# Patient Record
Sex: Male | Born: 1955 | Race: Black or African American | Hispanic: No | Marital: Married | Smoking: Current every day smoker
Health system: Southern US, Community
[De-identification: ages and names within clinical notes are randomized; demographics above are authoritative.]

## PROBLEM LIST (undated history)

## (undated) DIAGNOSIS — E785 Hyperlipidemia, unspecified: Secondary | ICD-10-CM

## (undated) DIAGNOSIS — E119 Type 2 diabetes mellitus without complications: Secondary | ICD-10-CM

## (undated) DIAGNOSIS — F17200 Nicotine dependence, unspecified, uncomplicated: Secondary | ICD-10-CM

## (undated) HISTORY — DX: Nicotine dependence, unspecified, uncomplicated: F17.200

## (undated) HISTORY — PX: LEG SURGERY: SHX1003

## (undated) HISTORY — DX: Hyperlipidemia, unspecified: E78.5

## (undated) HISTORY — DX: Type 2 diabetes mellitus without complications: E11.9

---

## 2009-02-05 ENCOUNTER — Ambulatory Visit: Payer: Self-pay | Admitting: Family Medicine

## 2009-02-05 IMAGING — CR DG LUMBAR SPINE 2-3V
1 series · 3 of 3 positions shown · non-contrast
Comparison: none

REASON FOR EXAM: [DATE]; [DATE]
COMMENTS:

[Series 2: view not recorded · 0.17mm/px · 3 of 3 slices shown]
[im 1/3]
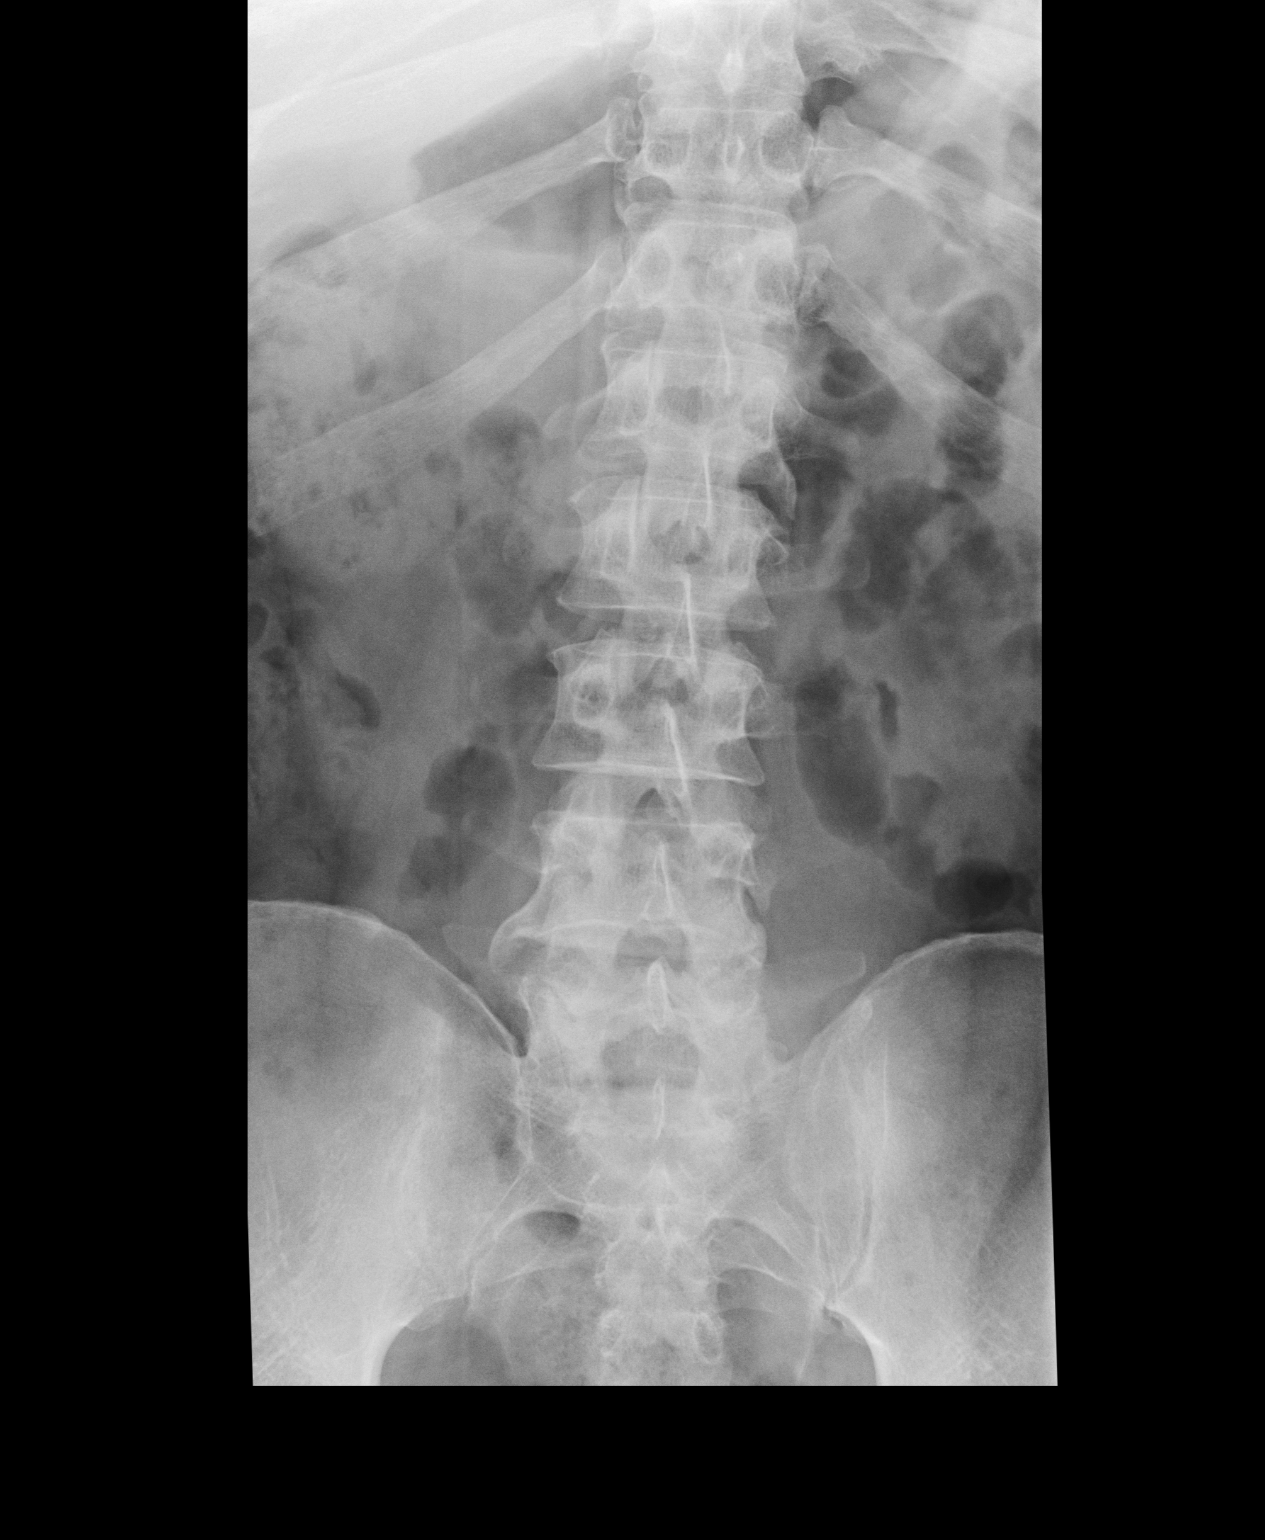
[im 2/3]
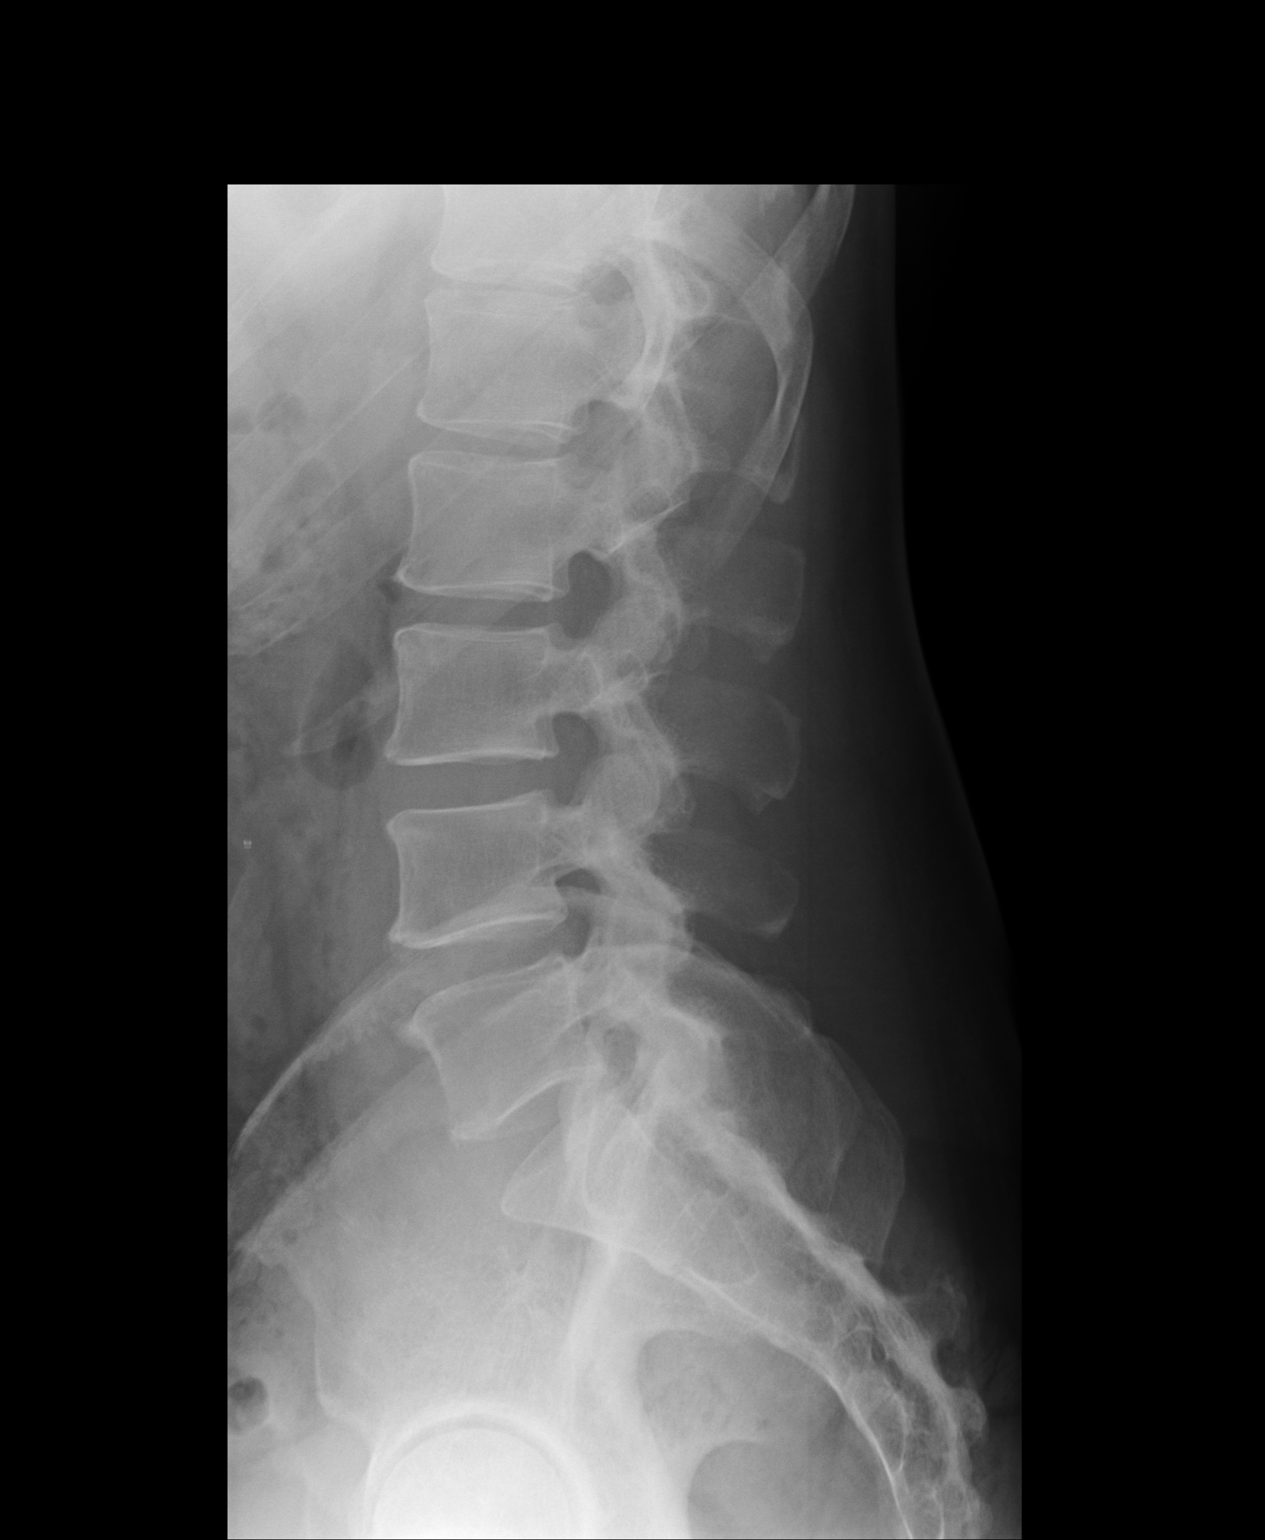
[im 3/3]
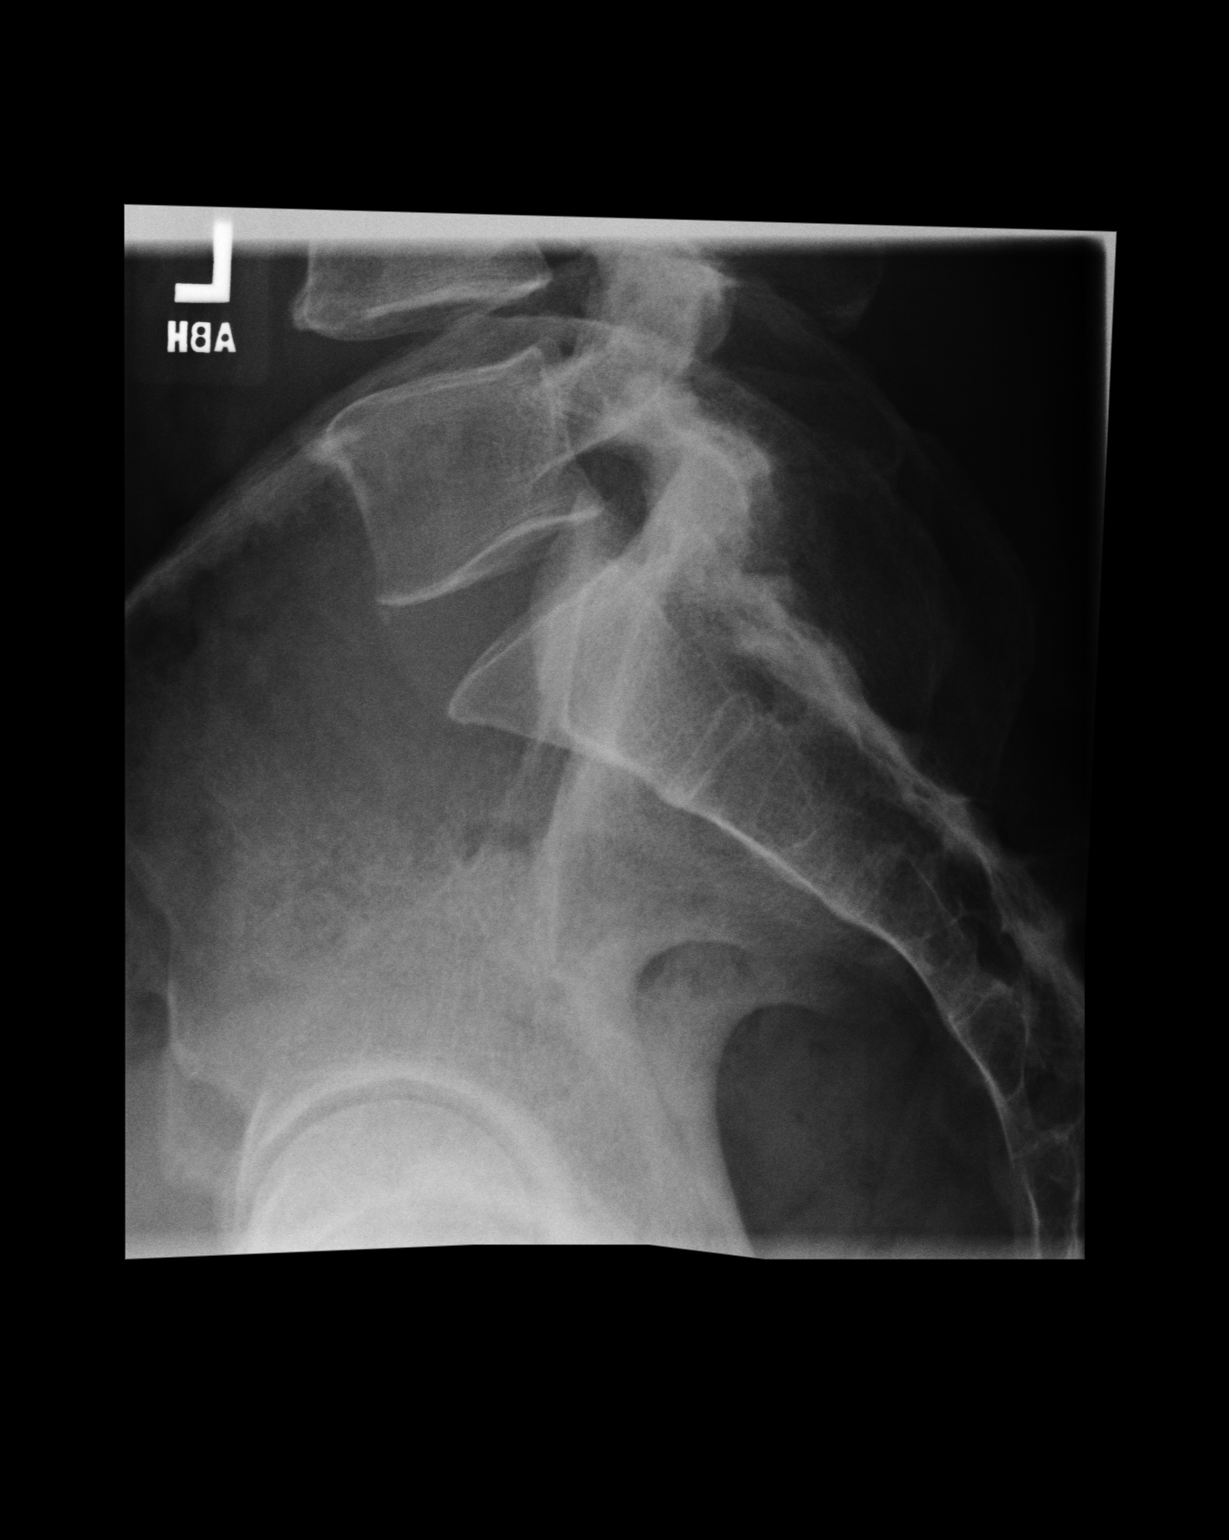

[3 of 3 positions shown; findings below may reference images not displayed]

PROCEDURE:     KDR - KDXR LUMBAR SPINE AP AND LATERAL  - [DATE]  [DATE]

RESULT:     The vertebral body heights and the intervertebral disc spaces
are well maintained. The vertebral body alignment is normal. The pedicles
are bilaterally intact. There is mild degenerative spurring anteriorly at
multiple levels of the lumbar spine.
IMPRESSION: 1. No acute bony abnormalities are seen.
2. Degenerative spurring is noted anteriorly at multiple levels.

## 2012-10-20 ENCOUNTER — Emergency Department: Payer: Self-pay | Admitting: Internal Medicine

## 2012-10-20 LAB — CBC
MCH: 29.8 pg (ref 26.0–34.0)
MCHC: 33.5 g/dL (ref 32.0–36.0)
MCV: 89 fL (ref 80–100)
Platelet: 185 10*3/uL (ref 150–440)
RBC: 4.59 10*6/uL (ref 4.40–5.90)
RDW: 13.4 % (ref 11.5–14.5)
WBC: 6.2 10*3/uL (ref 3.8–10.6)

## 2012-10-20 LAB — CK TOTAL AND CKMB (NOT AT ARMC)
CK, Total: 348 U/L — ABNORMAL HIGH (ref 35–232)
CK-MB: 2.1 ng/mL (ref 0.5–3.6)

## 2012-10-20 LAB — URINALYSIS, COMPLETE
Bacteria: NONE SEEN
Bilirubin,UR: NEGATIVE
Glucose,UR: NEGATIVE mg/dL (ref 0–75)
Ketone: NEGATIVE
Protein: 100
RBC,UR: 2 /HPF (ref 0–5)
Specific Gravity: 1.018 (ref 1.003–1.030)
Squamous Epithelial: 3
WBC UR: 3 /HPF (ref 0–5)

## 2012-10-20 LAB — TROPONIN I
Troponin-I: <0.02 ng/mL
Troponin-I: <0.02 ng/mL

## 2012-10-20 LAB — COMPREHENSIVE METABOLIC PANEL
Albumin: 3.6 g/dL (ref 3.4–5.0)
Alkaline Phosphatase: 73 U/L (ref 50–136)
BUN: 10 mg/dL (ref 7–18)
Bilirubin,Total: 0.2 mg/dL (ref 0.2–1.0)
Chloride: 106 mmol/L (ref 98–107)
Creatinine: 1.21 mg/dL (ref 0.60–1.30)
EGFR (African American): >60
EGFR (Non-African Amer.): >60
Glucose: 112 mg/dL — ABNORMAL HIGH (ref 65–99)
Osmolality: 275 (ref 275–301)
SGPT (ALT): 19 U/L (ref 12–78)
Sodium: 138 mmol/L (ref 136–145)

## 2012-10-20 IMAGING — CT CT HEAD WITHOUT CONTRAST
1 series · 16 of 30 positions shown, 20 images · non-contrast
Comparison: none

REASON FOR EXAM: ha
COMMENTS:

PROCEDURE:     CT  - CT HEAD WITHOUT CONTRAST  - [DATE] [DATE]
RESULT:     Comparison:  None
TECHNIQUE: Multiple axial images from the foramen magnum to the vertex were
obtained without IV contrast.

[Series 2: soft tissue · axial · 0.42mm/px · z∈[-155,-20]mm · 16 of 30 slices shown, 20 images]
[im 2/30  brain]
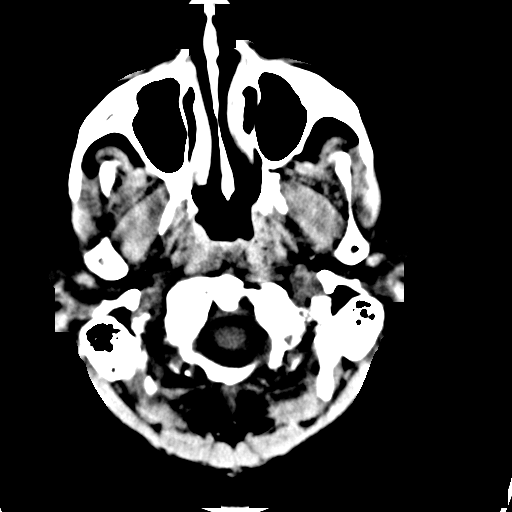
[im 2/30  bone]
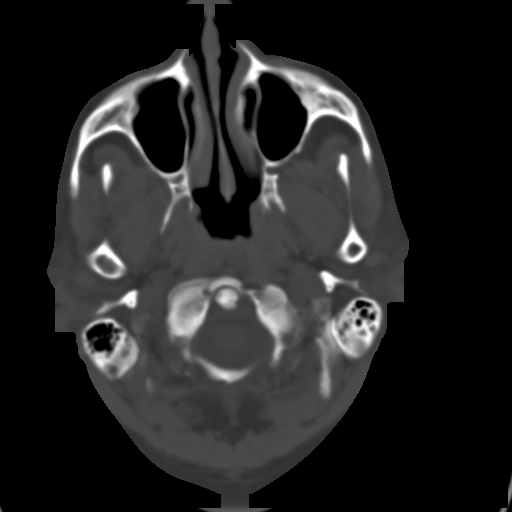
[im 4/30  brain]
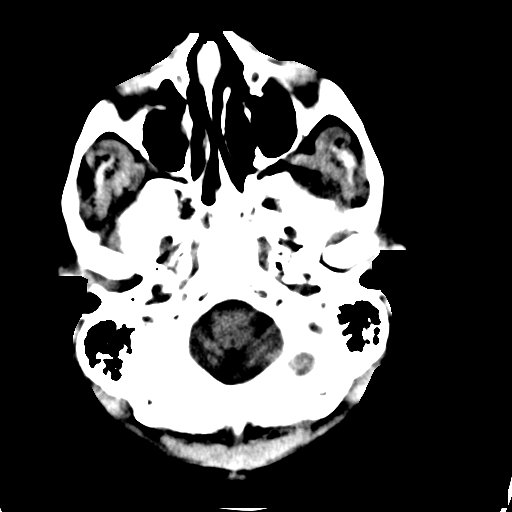
[im 6/30  brain]
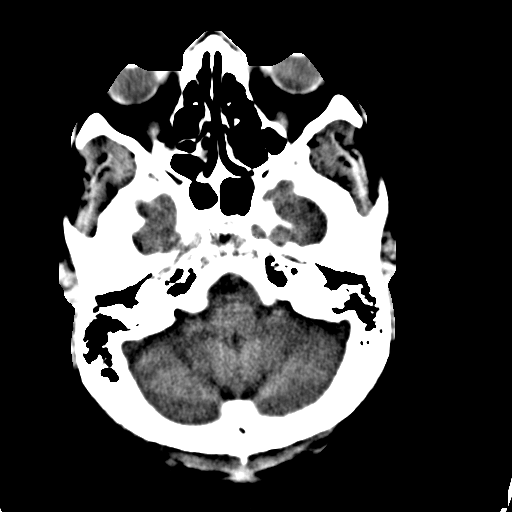
[im 8/30  brain]
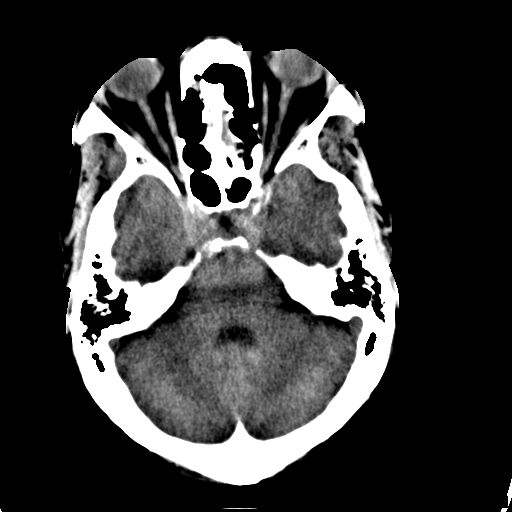
[im 9/30  brain]
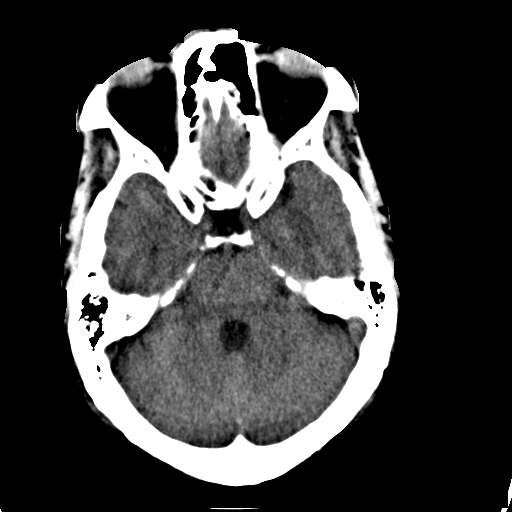
[im 9/30  bone]
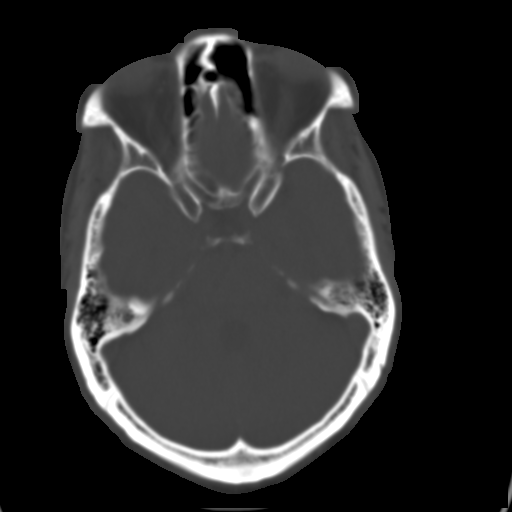
[im 11/30  brain]
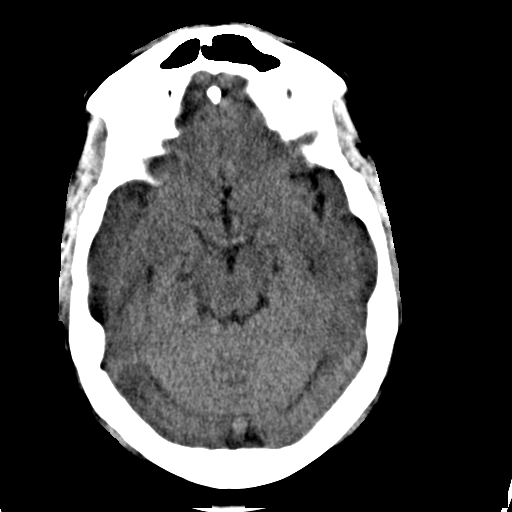
[im 13/30  brain]
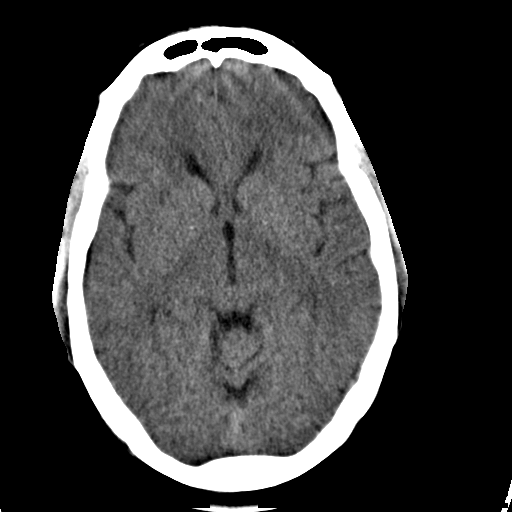
[im 15/30  brain]
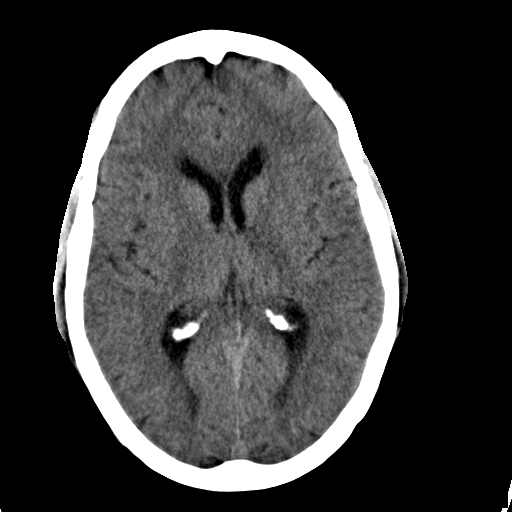
[im 16/30  brain]
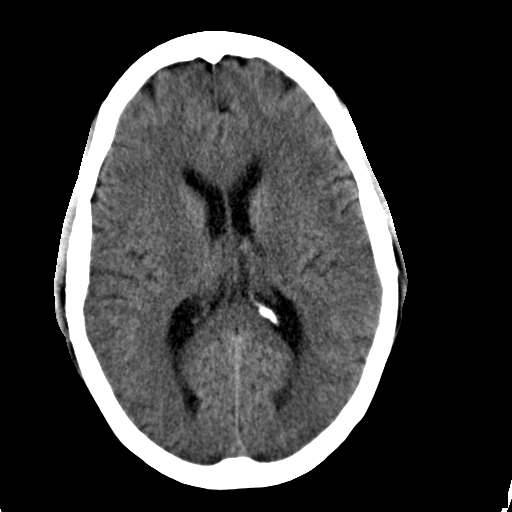
[im 16/30  bone]
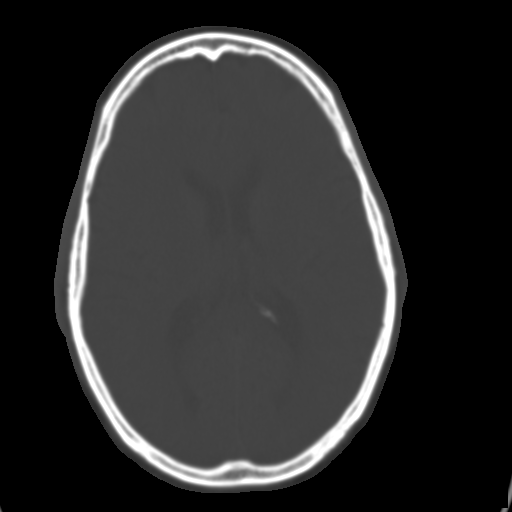
[im 18/30  brain]
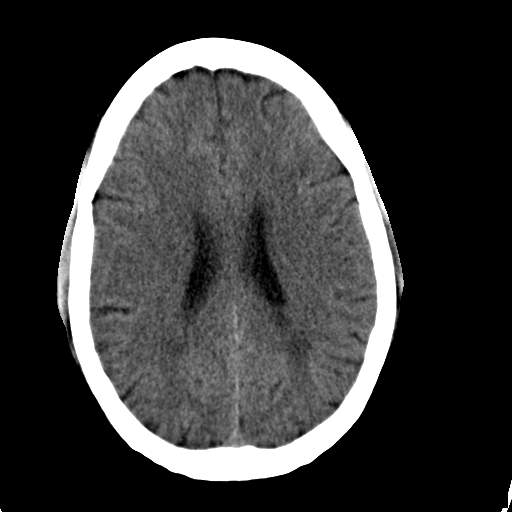
[im 20/30  brain]
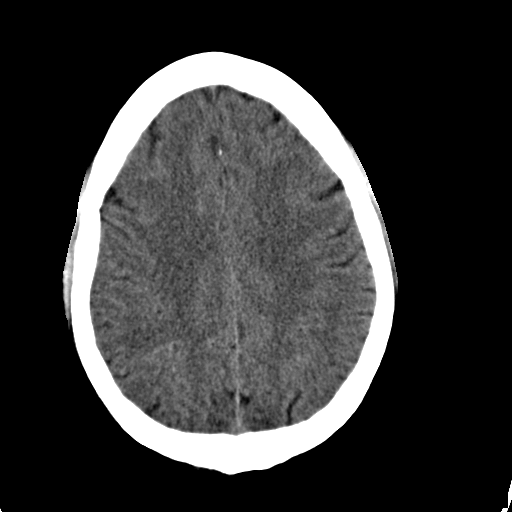
[im 22/30  brain]
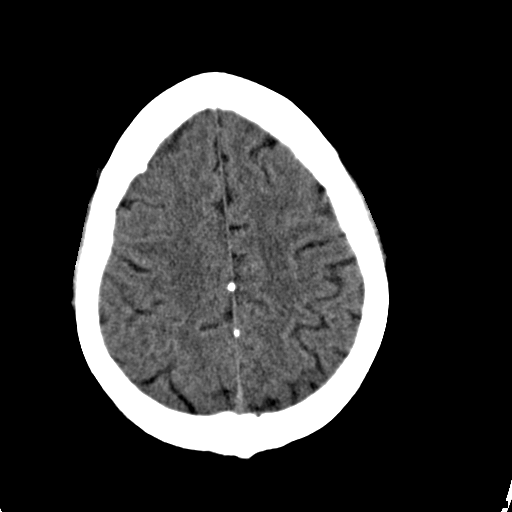
[im 23/30  brain]
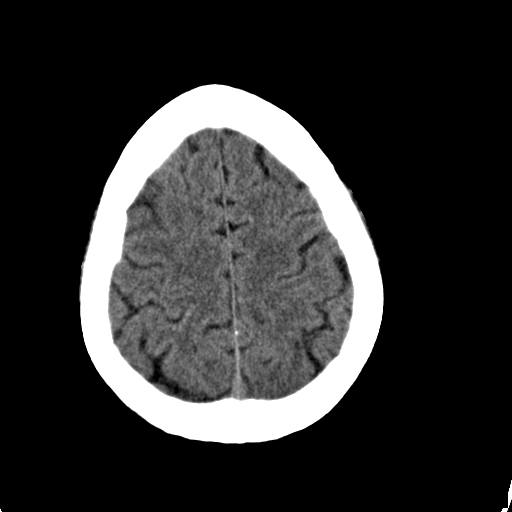
[im 23/30  bone]
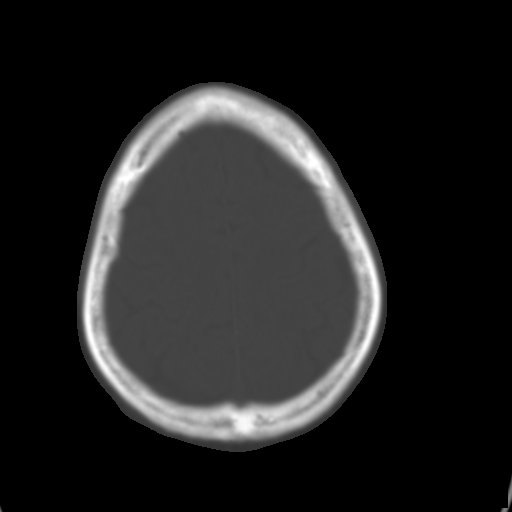
[im 25/30  brain]
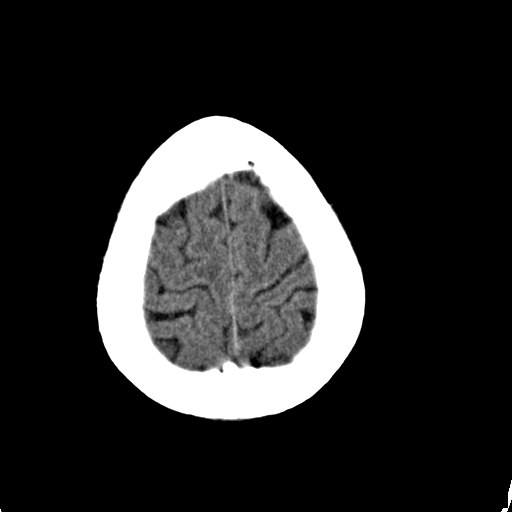
[im 27/30  brain]
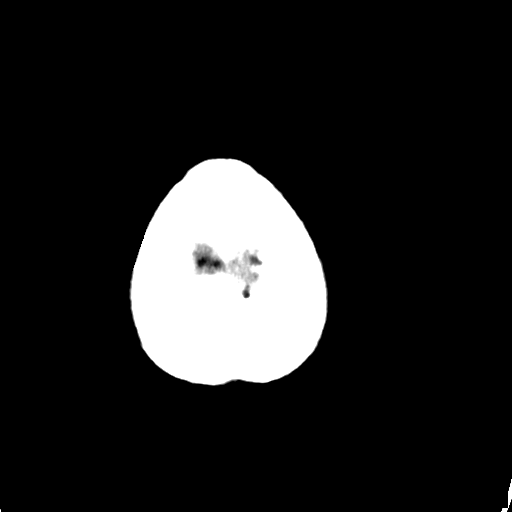
[im 29/30  brain]
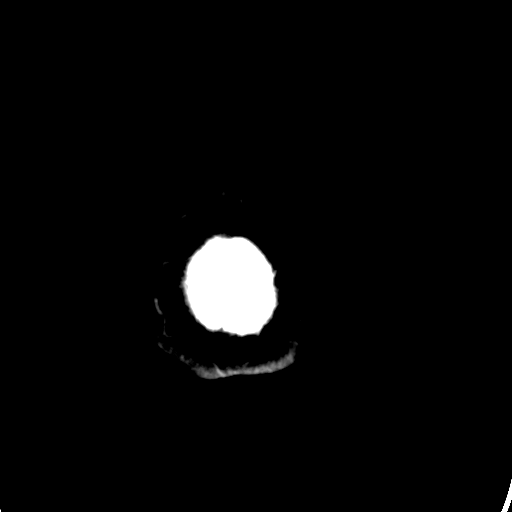

[16 of 30 positions shown; findings below may reference images not displayed]

FINDINGS: There is no evidence of mass effect, midline shift, or extra-axial fluid
collections.  There is no evidence of a space-occupying lesion or
intracranial hemorrhage. There is no evidence of a cortical-based area of
acute infarction.

The ventricles and sulci are appropriate for the patient's age. The basal
cisterns are patent.

Visualized portions of the orbits are unremarkable. The visualized portions
of the paranasal sinuses and mastoid air cells are unremarkable.

The osseous structures are unremarkable.
IMPRESSION: No acute intracranial process.

[REDACTED]

## 2016-02-27 ENCOUNTER — Other Ambulatory Visit: Payer: Self-pay | Admitting: Family Medicine

## 2016-04-24 ENCOUNTER — Other Ambulatory Visit: Payer: Self-pay | Admitting: Family Medicine

## 2016-04-24 DIAGNOSIS — E119 Type 2 diabetes mellitus without complications: Secondary | ICD-10-CM

## 2016-04-24 DIAGNOSIS — I10 Essential (primary) hypertension: Secondary | ICD-10-CM

## 2016-04-24 MED ORDER — HYDROCHLOROTHIAZIDE 12.5 MG PO CAPS: 12.5000 mg | ORAL_CAPSULE | Freq: Every day | ORAL | Status: DC

## 2016-04-24 MED ORDER — METFORMIN HCL 500 MG PO TABS: 500.0000 mg | ORAL_TABLET | Freq: Two times a day (BID) | ORAL | Status: DC

## 2016-04-24 MED ORDER — GLIPIZIDE 10 MG PO TABS: 10.0000 mg | ORAL_TABLET | Freq: Every day | ORAL | Status: DC

## 2016-04-25 ENCOUNTER — Encounter: Payer: Self-pay | Admitting: Family Medicine

## 2017-05-31 ENCOUNTER — Other Ambulatory Visit: Payer: Self-pay | Admitting: Family Medicine

## 2017-05-31 DIAGNOSIS — I10 Essential (primary) hypertension: Secondary | ICD-10-CM

## 2017-05-31 DIAGNOSIS — E119 Type 2 diabetes mellitus without complications: Secondary | ICD-10-CM
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
## 2019-07-18 ENCOUNTER — Other Ambulatory Visit: Payer: Self-pay

## 2019-07-18 ENCOUNTER — Encounter: Payer: Self-pay | Admitting: Internal Medicine

## 2019-07-18 ENCOUNTER — Ambulatory Visit (INDEPENDENT_AMBULATORY_CARE_PROVIDER_SITE_OTHER): Payer: Managed Care, Other (non HMO) | Admitting: Internal Medicine

## 2019-07-18 VITALS — BP 144/80 | HR 78 | Ht 71.0 in | Wt 188.0 lb

## 2019-07-18 DIAGNOSIS — R9431 Abnormal electrocardiogram [ECG] [EKG]: Secondary | ICD-10-CM

## 2019-07-18 DIAGNOSIS — I10 Essential (primary) hypertension: Secondary | ICD-10-CM | POA: Diagnosis not present

## 2019-07-18 DIAGNOSIS — N289 Disorder of kidney and ureter, unspecified: Secondary | ICD-10-CM | POA: Diagnosis not present

## 2019-07-18 DIAGNOSIS — R0609 Other forms of dyspnea: Secondary | ICD-10-CM

## 2019-07-18 NOTE — Patient Instructions (Addendum)
Medication Instructions:  Your physician recommends that you continue on your current medications as directed. Please refer to the Current Medication list given to you today.  If you need a refill on your cardiac medications before your next appointment, please call your pharmacy.   Lab work: - None ordered.  If you have labs (blood work) drawn today and your tests are completely normal, you will receive your results only by: Marland Kitchen MyChart Message (if you have MyChart) OR . A paper copy in the mail If you have any lab test that is abnormal or we need to change your treatment, we will call you to review the results.  Testing/Procedures: Your physician has requested that you have an echocardiogram. Echocardiography is a painless test that uses sound waves to create images of your heart. It provides your doctor with information about the size and shape of your heart and how well your heart's chambers and valves are working. This procedure takes approximately one hour. There are no restrictions for this procedure. You may get an IV, if needed, to receive an ultrasound enhancing agent through to better visualize your heart.    Follow-Up: At [facility], you and your health needs are our priority.  As part of our continuing mission to provide you with exceptional heart care, we have created designated Provider Care Teams.  These Care Teams include your primary Cardiologist (physician) and Advanced Practice Providers (APPs -  Physician Assistants and Nurse Practitioners) who all work together to provide you with the care you need, when you need it. You will need a follow up appointment in 1 months.  You may see DR Harrell Gave END or one of the following Advanced Practice Providers on your designated Care Team:   Murray Hodgkins, NP Christell Faith, PA-C . Marrianne Mood, PA-C    Echocardiogram An echocardiogram is a procedure that uses painless sound waves (ultrasound) to produce an image of the  heart. Images from an echocardiogram can provide important information about:  Signs of coronary artery disease (CAD).  Aneurysm detection. An aneurysm is a weak or damaged part of an artery wall that bulges out from the normal force of blood pumping through the body.  Heart size and shape. Changes in the size or shape of the heart can be associated with certain conditions, including heart failure, aneurysm, and CAD.  Heart muscle function.  Heart valve function.  Signs of a past heart attack.  Fluid buildup around the heart.  Thickening of the heart muscle.  A tumor or infectious growth around the heart valves. Tell a health care provider about:  Any allergies you have.  All medicines you are taking, including vitamins, herbs, eye drops, creams, and over-the-counter medicines.  Any blood disorders you have.  Any surgeries you have had.  Any medical conditions you have.  Whether you are pregnant or may be pregnant. What are the risks? Generally, this is a safe procedure. However, problems may occur, including:  Allergic reaction to dye (contrast) that may be used during the procedure. What happens before the procedure? No specific preparation is needed. You may eat and drink normally. What happens during the procedure?   An IV tube may be inserted into one of your veins.  You may receive contrast through this tube. A contrast is an injection that improves the quality of the pictures from your heart.  A gel will be applied to your chest.  A wand-like tool (transducer) will be moved over your chest. The gel will  help to transmit the sound waves from the transducer.  The sound waves will harmlessly bounce off of your heart to allow the heart images to be captured in real-time motion. The images will be recorded on a computer. The procedure may vary among health care providers and hospitals. What happens after the procedure?  You may return to your normal, everyday  life, including diet, activities, and medicines, unless your health care provider tells you not to do that. Summary  An echocardiogram is a procedure that uses painless sound waves (ultrasound) to produce an image of the heart.  Images from an echocardiogram can provide important information about the size and shape of your heart, heart muscle function, heart valve function, and fluid buildup around your heart.  You do not need to do anything to prepare before this procedure. You may eat and drink normally.  After the echocardiogram is completed, you may return to your normal, everyday life, unless your health care provider tells you not to do that. This information is not intended to replace advice given to you by your health care provider. Make sure you discuss any questions you have with your health care provider. Document Released: 10/17/2000 Document Revised: 02/10/2019 Document Reviewed: 11/22/2016 Elsevier Patient Education  2020 [employer].

## 2019-08-12 ENCOUNTER — Ambulatory Visit (INDEPENDENT_AMBULATORY_CARE_PROVIDER_SITE_OTHER): Payer: Managed Care, Other (non HMO)

## 2019-08-12 ENCOUNTER — Other Ambulatory Visit: Payer: Self-pay

## 2019-08-12 DIAGNOSIS — R0600 Dyspnea, unspecified: Secondary | ICD-10-CM | POA: Diagnosis not present

## 2019-08-12 DIAGNOSIS — R9431 Abnormal electrocardiogram [ECG] [EKG]: Secondary | ICD-10-CM

## 2019-08-12 DIAGNOSIS — R0609 Other forms of dyspnea: Secondary | ICD-10-CM

## 2019-08-15 ENCOUNTER — Telehealth: Payer: Self-pay | Admitting: *Deleted

## 2019-08-15 DIAGNOSIS — R079 Chest pain, unspecified: Secondary | ICD-10-CM

## 2019-08-15 DIAGNOSIS — R9431 Abnormal electrocardiogram [ECG] [EKG]: Secondary | ICD-10-CM

## 2019-08-15 NOTE — Telephone Encounter (Signed)
-----   Message from Nelva Bush, MD sent at 08/15/2019  7:30 AM EDT ----- Please let Mr. Grinder know that his heart is contracting normally without significant valvular abnormalities.  There is mild stiffening of the left ventricle, which may be related to his history of hypertension, diabetes, and age.  Given atypical chest pain and abnormal EKG discussed at our recent visit, I recommend that we obtain a pharmacologic myocardial perfusion stress test at Mr. Difabio' convenience.

## 2019-08-16 NOTE — Telephone Encounter (Signed)
Results called to pt. Pt verbalized understanding and is agreeable to [place]. He verbalized understanding of the following preprocedural instructions.    [place]  Your caregiver has ordered a Stress Test with nuclear imaging. The purpose of this test is to evaluate the blood supply to your heart muscle. This procedure is referred to as a "Non-Invasive Stress Test." This is because other than having an IV started in your vein, nothing is inserted or "invades" your body. Cardiac stress tests are done to find areas of poor blood flow to the heart by determining the extent of coronary artery disease (CAD). Some patients exercise on a treadmill, which naturally increases the blood flow to your heart, while others who are  unable to walk on a treadmill due to physical limitations have a pharmacologic/chemical stress agent called Lexiscan . This medicine will mimic walking on a treadmill by temporarily increasing your coronary blood flow.   Please note: these test may take anywhere between 2-4 hours to complete  PLEASE REPORT TO [facility] AT THE FIRST DESK WILL DIRECT YOU WHERE TO GO  Date of Procedure:____10/16/20__________  Arrival Time for Procedure:_____08:15 am___________  Instructions regarding medication:   _xx_ : Hold diabetes medication morning of procedure - Metformin/Glipizide  _xx_:  Hold other medications as follows:________HCTZ ____  PLEASE NOTIFY THE OFFICE AT LEAST 24 HOURS IN ADVANCE IF YOU ARE UNABLE TO [place].  [phone] AND  PLEASE NOTIFY NUCLEAR MEDICINE AT [facility] AT LEAST 24 HOURS IN ADVANCE IF YOU ARE UNABLE TO KEEP YOUR APPOINTMENT. [phone]  How to prepare for your Myoview test:  1. Do not eat or drink after midnight 2. No caffeine for 24 hours prior to test 3. No smoking 24 hours prior to test. 4. Your medication may be taken with water.  If your doctor stopped a medication because of this test, do not take  that medication. 5. Ladies, please do not wear dresses.  Skirts or pants are appropriate. Please wear a short sleeve shirt. 6. No perfume, cologne or lotion. 7. Wear comfortable walking shoes. No heels!

## 2019-08-17 ENCOUNTER — Ambulatory Visit: Payer: Medicare HMO | Admitting: Internal Medicine

## 2019-08-19 ENCOUNTER — Encounter
Admission: RE | Admit: 2019-08-19 | Discharge: 2019-08-19 | Disposition: A | Discharge status: Home / Self Care | Payer: Managed Care, Other (non HMO) | Source: Ambulatory Visit | Attending: Internal Medicine | Admitting: Internal Medicine

## 2019-08-19 ENCOUNTER — Other Ambulatory Visit: Payer: Self-pay

## 2019-08-19 DIAGNOSIS — R079 Chest pain, unspecified: Secondary | ICD-10-CM | POA: Diagnosis present

## 2019-08-19 DIAGNOSIS — R9431 Abnormal electrocardiogram [ECG] [EKG]: Secondary | ICD-10-CM | POA: Insufficient documentation

## 2019-08-19 LAB — NM MYOCAR MULTI W/SPECT W/WALL MOTION / EF
Estimated workload: 1 METS
Exercise duration (min): 0 min
Exercise duration (sec): 0 s
LV dias vol: 70 mL (ref 62–150)
LV sys vol: 28 mL
MPHR: 155 {beats}/min
Peak HR: 88 {beats}/min
Percent HR: 56 %
Rest HR: 53 {beats}/min
SDS: 0
SRS: 3
SSS: 0
TID: 0.93

## 2019-08-19 MED ORDER — REGADENOSON 0.4 MG/5ML IV SOLN
0.4000 mg | Freq: Once | INTRAVENOUS | Status: AC
  Administered 2019-08-19: 0.4 mg via INTRAVENOUS
  Filled 2019-08-19: qty 5

## 2019-08-19 MED ORDER — TECHNETIUM TC 99M TETROFOSMIN IV KIT
30.0000 | PACK | Freq: Once | INTRAVENOUS | Status: AC | PRN
  Administered 2019-08-19: 30.98 via INTRAVENOUS

## 2019-08-19 MED ORDER — TECHNETIUM TC 99M TETROFOSMIN IV KIT
10.0730 | PACK | Freq: Once | INTRAVENOUS | Status: AC | PRN
  Administered 2019-08-19: 10.073 via INTRAVENOUS

## 2019-08-22 NOTE — Progress Notes (Signed)
Follow-up Outpatient Visit Date: 08/24/2019  Primary Care Provider: Marguerita Merles, [place] [address]  Chief Complaint: Follow-up shortness of breath and abnormal EKG.  HPI:  Stephen Alvarado is a 63 y.o. year-old male with history of hypertension, hyperlipidemia, and type 2 diabetes mellitus, who presents for follow-up of hypertension and abnormal EKG.  I met Stephen Alvarado last month, at which time he endorsed dyspnea with vigorous exertion that he attributed to his long history of tobacco use.  We agreed to perform a echocardiogram followed by pharmacologic myocardial perfusion stress test.  These studies were notable for LVEF 60-65% with grade 1 diastolic dysfunction and no significant ischemia.  Today, Stephen Alvarado reports feeling well other than intermittent right hip pain.  He was told in the past that he may need a hip replacement, though he would like to delay this as long as possible.  He denies chest pain, shortness of breath, and edema.  He does not check his BP at home.  --------------------------------------------------------------------------------------------------  Cardiovascular History & Procedures: Cardiovascular Problems:  Abnormal EKG  Risk Factors:  Hypertension, hyperlipidemia, diabetes mellitus, tobacco use, male gender, and age > 34  Cath/PCI:  None  CV Surgery:  None  EP Procedures and Devices:  None  Non-Invasive Evaluation(s):  Pharmacologic MPI (08/19/2019): Low risk study without ischemia or scar.  Mild aortic atherosclerosis noted.  TTE (08/12/2019): Normal LV size and wall thickness.  LVEF 60-65% with grade 1 diastolic dysfunction.  Normal RV size and function.  No significant valvular abnormality.  Recent CV Pertinent Labs: Lab Results  Component Value Date   K 4.1 10/20/2012   BUN 10 10/20/2012   CREATININE 1.21 10/20/2012    Past medical and surgical history were reviewed and updated in EPIC.  No outpatient  medications have been marked as taking for the 08/24/19 encounter (Office Visit) with Stephen Alvarado, Stephen Gave, MD.    Allergies: Penicillins  Social History   Tobacco Use  . Smoking status: Current Every Day Smoker    Packs/day: 0.66    Years: 20.00    Pack years: 13.20    Types: Cigarettes  . Smokeless tobacco: Never Used  Substance Use Topics  . Alcohol use: Not Currently  . Drug use: Never    Family History  Problem Relation Age of Onset  . Hypertension Mother   . Heart disease Father   . Hypertension Father   . Kidney failure Sister   . Diabetes Sister   . Kidney failure Brother   . Cirrhosis Sister   . Cancer Brother   . Cancer Brother     Review of Systems: A 12-system review of systems was performed and was negative except as noted in the HPI.  --------------------------------------------------------------------------------------------------  Physical Exam: BP 128/78 (BP Location: Left Arm, Patient Position: Sitting, Cuff Size: Normal)   Pulse 75   Ht _0  (1.803 m)   Wt 190 lb 8 oz (86.4 kg)   SpO2 98%   BMI 26.57 kg/m   General:  NAD HEENT: No conjunctival pallor or scleral icterus. Facemask in place. Neck: Supple without lymphadenopathy, thyromegaly, JVD, or HJR. Lungs: Normal work of breathing. Clear to auscultation bilaterally without wheezes or crackles. Heart: Regular rate and rhythm without murmurs, rubs, or gallops. Non-displaced PMI. Abd: Bowel sounds present. Soft, NT/ND without hepatosplenomegaly Ext: No lower extremity edema. Skin: Warm and dry without rash.  Lab Results  Component Value Date   WBC 6.2 10/20/2012   HGB 13.7 10/20/2012  HCT 41.0 10/20/2012   MCV 89 10/20/2012   PLT 185 10/20/2012    Lab Results  Component Value Date   NA 138 10/20/2012   K 4.1 10/20/2012   CL 106 10/20/2012   CO2 27 10/20/2012   BUN 10 10/20/2012   CREATININE 1.21 10/20/2012   GLUCOSE 112 (H) 10/20/2012   ALT 19 10/20/2012    No results found  for: CHOL, HDL, LDLCALC, LDLDIRECT, TRIG, CHOLHDL  --------------------------------------------------------------------------------------------------  ASSESSMENT AND PLAN: Dyspnea on exertion and abnormal EKG: Stephen Alvarado denies significant shortness of breath at today's visit.  Recent echocardiogram and myocardial perfusion stress test were reassuring without significant abnormalities.  I do not recommend any further testing at this time.  Primary prevention of CAD is recommended.  Hypertension: Blood pressure controlled today.  Continue current medications.  Aortic atherosclerosis: Incidentally noted on recent stress test.  I recommend continuation of aspirin and statin therapy.  Smoking cessation recommended.  Follow-up: Return to clinic as needed.  Nelva Bush, MD 08/24/2019 2:25 PM

## 2019-08-24 ENCOUNTER — Other Ambulatory Visit: Payer: Self-pay

## 2019-08-24 ENCOUNTER — Ambulatory Visit (INDEPENDENT_AMBULATORY_CARE_PROVIDER_SITE_OTHER): Payer: Managed Care, Other (non HMO) | Admitting: Internal Medicine

## 2019-08-24 ENCOUNTER — Encounter: Payer: Self-pay | Admitting: Internal Medicine

## 2019-08-24 VITALS — BP 128/78 | HR 75 | Ht 71.0 in | Wt 190.5 lb

## 2019-08-24 DIAGNOSIS — I10 Essential (primary) hypertension: Secondary | ICD-10-CM | POA: Diagnosis not present

## 2019-08-24 DIAGNOSIS — R9431 Abnormal electrocardiogram [ECG] [EKG]: Secondary | ICD-10-CM

## 2019-08-24 DIAGNOSIS — R0609 Other forms of dyspnea: Secondary | ICD-10-CM

## 2019-08-24 DIAGNOSIS — I700 Atherosclerosis of aorta: Secondary | ICD-10-CM

## 2019-08-24 DIAGNOSIS — R0600 Dyspnea, unspecified: Secondary | ICD-10-CM

## 2019-08-24 NOTE — Patient Instructions (Signed)
Medication Instructions:  Your physician recommends that you continue on your current medications as directed. Please refer to the Current Medication list given to you today.  *If you need a refill on your cardiac medications before your next appointment, please call your pharmacy*  Lab Work: none If you have labs (blood work) drawn today and your tests are completely normal, you will receive your results only by: Marland Kitchen MyChart Message (if you have MyChart) OR . A paper copy in the mail If you have any lab test that is abnormal or we need to change your treatment, we will call you to review the results.  Testing/Procedures: none  Follow-Up: At [facility], you and your health needs are our priority.  As part of our continuing mission to provide you with exceptional heart care, we have created designated Provider Care Teams.  These Care Teams include your primary Cardiologist (physician) and Advanced Practice Providers (APPs -  Physician Assistants and Nurse Practitioners) who all work together to provide you with the care you need, when you need it.  Your next appointment:   as needed.  The format for your next appointment:   n/a  Provider:    You may see DR Harrell Gave END or one of the following Advanced Practice Providers on your designated Care Team:    Murray Hodgkins, NP  Christell Faith, PA-C  Marrianne Mood, PA-C

## 2019-08-25 ENCOUNTER — Encounter: Payer: Self-pay | Admitting: Internal Medicine

## 2019-08-25 DIAGNOSIS — R9431 Abnormal electrocardiogram [ECG] [EKG]: Secondary | ICD-10-CM | POA: Insufficient documentation

## 2019-08-25 DIAGNOSIS — I10 Essential (primary) hypertension: Secondary | ICD-10-CM | POA: Insufficient documentation

## 2019-08-25 DIAGNOSIS — I700 Atherosclerosis of aorta: Secondary | ICD-10-CM | POA: Insufficient documentation

## 2020-08-14 ENCOUNTER — Other Ambulatory Visit: Payer: Self-pay | Admitting: Nephrology

## 2020-08-14 DIAGNOSIS — R809 Proteinuria, unspecified: Secondary | ICD-10-CM

## 2020-08-14 DIAGNOSIS — N1832 Chronic kidney disease, stage 3b: Secondary | ICD-10-CM

## 2020-08-31 ENCOUNTER — Encounter: Payer: Self-pay | Admitting: Oncology

## 2020-08-31 NOTE — Progress Notes (Signed)
patient prescreened for New patient vist. Will establish care for [facility].

## 2020-09-03 ENCOUNTER — Other Ambulatory Visit: Payer: Self-pay

## 2020-09-03 ENCOUNTER — Encounter (INDEPENDENT_AMBULATORY_CARE_PROVIDER_SITE_OTHER): Payer: Self-pay

## 2020-09-03 ENCOUNTER — Inpatient Hospital Stay: Payer: Medicare HMO | Attending: Oncology | Admitting: Oncology

## 2020-09-03 ENCOUNTER — Inpatient Hospital Stay: Payer: Medicare HMO

## 2020-09-03 VITALS — BP 170/89 | HR 76 | Temp 97.8°F | Resp 18 | Wt 199.5 lb

## 2020-09-03 DIAGNOSIS — Z79899 Other long term (current) drug therapy: Secondary | ICD-10-CM | POA: Insufficient documentation

## 2020-09-03 DIAGNOSIS — E785 Hyperlipidemia, unspecified: Secondary | ICD-10-CM | POA: Insufficient documentation

## 2020-09-03 DIAGNOSIS — D472 Monoclonal gammopathy: Secondary | ICD-10-CM | POA: Diagnosis not present

## 2020-09-03 DIAGNOSIS — R778 Other specified abnormalities of plasma proteins: Secondary | ICD-10-CM | POA: Diagnosis not present

## 2020-09-03 DIAGNOSIS — E119 Type 2 diabetes mellitus without complications: Secondary | ICD-10-CM | POA: Diagnosis not present

## 2020-09-03 DIAGNOSIS — Z809 Family history of malignant neoplasm, unspecified: Secondary | ICD-10-CM | POA: Insufficient documentation

## 2020-09-03 DIAGNOSIS — D631 Anemia in chronic kidney disease: Secondary | ICD-10-CM | POA: Diagnosis not present

## 2020-09-03 DIAGNOSIS — N289 Disorder of kidney and ureter, unspecified: Secondary | ICD-10-CM | POA: Insufficient documentation

## 2020-09-03 DIAGNOSIS — Z833 Family history of diabetes mellitus: Secondary | ICD-10-CM | POA: Diagnosis not present

## 2020-09-03 DIAGNOSIS — N1832 Chronic kidney disease, stage 3b: Secondary | ICD-10-CM

## 2020-09-03 DIAGNOSIS — Z8249 Family history of ischemic heart disease and other diseases of the circulatory system: Secondary | ICD-10-CM | POA: Diagnosis not present

## 2020-09-03 DIAGNOSIS — Z7984 Long term (current) use of oral hypoglycemic drugs: Secondary | ICD-10-CM | POA: Diagnosis not present

## 2020-09-03 DIAGNOSIS — Z8379 Family history of other diseases of the digestive system: Secondary | ICD-10-CM | POA: Insufficient documentation

## 2020-09-03 DIAGNOSIS — Z7982 Long term (current) use of aspirin: Secondary | ICD-10-CM | POA: Insufficient documentation

## 2020-09-03 DIAGNOSIS — C8590 Non-Hodgkin lymphoma, unspecified, unspecified site: Secondary | ICD-10-CM | POA: Diagnosis present

## 2020-09-03 DIAGNOSIS — F17210 Nicotine dependence, cigarettes, uncomplicated: Secondary | ICD-10-CM | POA: Diagnosis not present

## 2020-09-03 LAB — COMPREHENSIVE METABOLIC PANEL
ALT: 20 U/L (ref 0–44)
AST: 20 U/L (ref 15–41)
Albumin: 3.7 g/dL (ref 3.5–5.0)
Alkaline Phosphatase: 53 U/L (ref 38–126)
Anion gap: 8 (ref 5–15)
BUN: 23 mg/dL (ref 8–23)
CO2: 27 mmol/L (ref 22–32)
Calcium: 9.5 mg/dL (ref 8.9–10.3)
Chloride: 102 mmol/L (ref 98–111)
Creatinine, Ser: 1.86 mg/dL — ABNORMAL HIGH (ref 0.61–1.24)
GFR, Estimated: 39 mL/min — ABNORMAL LOW (ref >60)
Glucose, Bld: 190 mg/dL — ABNORMAL HIGH (ref 70–99)
Potassium: 4.3 mmol/L (ref 3.5–5.1)
Sodium: 137 mmol/L (ref 135–145)
Total Bilirubin: 0.6 mg/dL (ref 0.3–1.2)
Total Protein: 7.7 g/dL (ref 6.5–8.1)

## 2020-09-03 LAB — CBC WITH DIFFERENTIAL/PLATELET
Abs Immature Granulocytes: 0.03 10*3/uL (ref 0.00–0.07)
Basophils Absolute: 0 10*3/uL (ref 0.0–0.1)
Basophils Relative: 1 %
Eosinophils Absolute: 0.5 10*3/uL (ref 0.0–0.5)
Eosinophils Relative: 8 %
HCT: 37.1 % — ABNORMAL LOW (ref 39.0–52.0)
Hemoglobin: 12.5 g/dL — ABNORMAL LOW (ref 13.0–17.0)
Immature Granulocytes: 1 %
Lymphocytes Relative: 27 %
Lymphs Abs: 1.6 10*3/uL (ref 0.7–4.0)
MCH: 29.8 pg (ref 26.0–34.0)
MCHC: 33.7 g/dL (ref 30.0–36.0)
MCV: 88.5 fL (ref 80.0–100.0)
Monocytes Absolute: 0.4 10*3/uL (ref 0.1–1.0)
Monocytes Relative: 6 %
Neutro Abs: 3.5 10*3/uL (ref 1.7–7.7)
Neutrophils Relative %: 57 %
Platelets: 192 10*3/uL (ref 150–400)
RBC: 4.19 MIL/uL — ABNORMAL LOW (ref 4.22–5.81)
RDW: 13.1 % (ref 11.5–15.5)
WBC: 6 10*3/uL (ref 4.0–10.5)
nRBC: 0 % (ref 0.0–0.2)
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
## 2020-09-04 LAB — KAPPA/LAMBDA LIGHT CHAINS
Kappa free light chain: 387.5 mg/L — ABNORMAL HIGH (ref 3.3–19.4)
Kappa, lambda light chain ratio: 20.95 — ABNORMAL HIGH (ref 0.26–1.65)
Lambda free light chains: 18.5 mg/L (ref 5.7–26.3)

## 2020-09-04 LAB — MULTIPLE MYELOMA PANEL, SERUM
Albumin SerPl Elph-Mcnc: 3.5 g/dL (ref 2.9–4.4)
Albumin/Glob SerPl: 0.9 (ref 0.7–1.7)
Alpha 1: 0.2 g/dL (ref 0.0–0.4)
Alpha2 Glob SerPl Elph-Mcnc: 0.6 g/dL (ref 0.4–1.0)
B-Globulin SerPl Elph-Mcnc: 0.8 g/dL (ref 0.7–1.3)
Gamma Glob SerPl Elph-Mcnc: 2.3 g/dL — ABNORMAL HIGH (ref 0.4–1.8)
Globulin, Total: 3.9 g/dL (ref 2.2–3.9)
IgA: 82 mg/dL (ref 61–437)
IgG (Immunoglobin G), Serum: 913 mg/dL (ref 603–1613)
IgM (Immunoglobulin M), Srm: 1954 mg/dL — ABNORMAL HIGH (ref 20–172)
M Protein SerPl Elph-Mcnc: 1.7 g/dL — ABNORMAL HIGH
Total Protein ELP: 7.4 g/dL (ref 6.0–8.5)

## 2020-09-05 ENCOUNTER — Telehealth: Payer: Self-pay

## 2020-09-05 DIAGNOSIS — R778 Other specified abnormalities of plasma proteins: Secondary | ICD-10-CM

## 2020-09-05 NOTE — Telephone Encounter (Signed)
-----  Message from Earlie Server, MD sent at 09/05/2020 12:07 AM EDT ----- Please let him know that his blood work came back concerning for plasma cell disorders.  I recommend him to proceed with bone marrow biopsy, as well as skeletal survey. Please order and arrange. Please change his appt to be 1 weeks after bm biopsy.

## 2020-09-05 NOTE — Telephone Encounter (Signed)
Done..  Pt appt has been sched for 09/20/20 @ 9:00

## 2020-09-05 NOTE — Telephone Encounter (Signed)
Spoke to patient and he is ok with proceeding with BM bx. Notified him that [facility] Also notified him that his scheduled follow up appt may change depending on when BM bx is scheduled.   Order for procedures entered. Will send request for bone marrow bx to specialized scheduling.

## 2020-09-05 NOTE — Telephone Encounter (Signed)
Patient scheduled for bone marrow bx on wed 09/12/20 @ 8:30a, pt to arrive 7:30a. Please reschedule MD appt to be 1 week after biopsy. I will contact pt with appts.

## 2020-09-06 NOTE — Telephone Encounter (Signed)
Pt notified of all appts and voiced understanding.

## 2020-09-07 NOTE — Progress Notes (Signed)
Patient on schedule for BMB 09/12/2020,called and spoke with wife on phone. Made aware to be here @ 0730, NPO after MN, and driver for home after discharge. Will hold glypizide am of procedure. Stated understanding.

## 2020-09-10 ENCOUNTER — Other Ambulatory Visit: Payer: Self-pay | Admitting: Radiology

## 2020-09-12 ENCOUNTER — Other Ambulatory Visit: Payer: Self-pay

## 2020-09-12 ENCOUNTER — Ambulatory Visit
Admission: RE | Admit: 2020-09-12 | Discharge: 2020-09-12 | Disposition: A | Discharge status: Home / Self Care | Payer: Medicare HMO | Source: Ambulatory Visit | Attending: Oncology | Admitting: Oncology

## 2020-09-12 DIAGNOSIS — I129 Hypertensive chronic kidney disease with stage 1 through stage 4 chronic kidney disease, or unspecified chronic kidney disease: Secondary | ICD-10-CM | POA: Diagnosis not present

## 2020-09-12 DIAGNOSIS — C9000 Multiple myeloma not having achieved remission: Secondary | ICD-10-CM | POA: Insufficient documentation

## 2020-09-12 DIAGNOSIS — Z7984 Long term (current) use of oral hypoglycemic drugs: Secondary | ICD-10-CM | POA: Insufficient documentation

## 2020-09-12 DIAGNOSIS — N1832 Chronic kidney disease, stage 3b: Secondary | ICD-10-CM | POA: Diagnosis not present

## 2020-09-12 DIAGNOSIS — Z79899 Other long term (current) drug therapy: Secondary | ICD-10-CM | POA: Diagnosis not present

## 2020-09-12 DIAGNOSIS — F17210 Nicotine dependence, cigarettes, uncomplicated: Secondary | ICD-10-CM | POA: Diagnosis not present

## 2020-09-12 DIAGNOSIS — E1122 Type 2 diabetes mellitus with diabetic chronic kidney disease: Secondary | ICD-10-CM | POA: Diagnosis not present

## 2020-09-12 DIAGNOSIS — R778 Other specified abnormalities of plasma proteins: Secondary | ICD-10-CM

## 2020-09-12 DIAGNOSIS — D649 Anemia, unspecified: Secondary | ICD-10-CM | POA: Insufficient documentation

## 2020-09-12 DIAGNOSIS — C8516 Unspecified B-cell lymphoma, intrapelvic lymph nodes: Secondary | ICD-10-CM | POA: Insufficient documentation

## 2020-09-12 LAB — CBC WITH DIFFERENTIAL/PLATELET
Abs Immature Granulocytes: 0.03 10*3/uL (ref 0.00–0.07)
Basophils Absolute: 0 10*3/uL (ref 0.0–0.1)
Basophils Relative: 1 %
Eosinophils Absolute: 0.5 10*3/uL (ref 0.0–0.5)
Eosinophils Relative: 7 %
HCT: 34.1 % — ABNORMAL LOW (ref 39.0–52.0)
Hemoglobin: 11.4 g/dL — ABNORMAL LOW (ref 13.0–17.0)
Immature Granulocytes: 0 %
Lymphocytes Relative: 25 %
Lymphs Abs: 1.7 10*3/uL (ref 0.7–4.0)
MCH: 29.7 pg (ref 26.0–34.0)
MCHC: 33.4 g/dL (ref 30.0–36.0)
MCV: 88.8 fL (ref 80.0–100.0)
Monocytes Absolute: 0.5 10*3/uL (ref 0.1–1.0)
Monocytes Relative: 7 %
Neutro Abs: 4 10*3/uL (ref 1.7–7.7)
Neutrophils Relative %: 60 %
Platelets: 170 10*3/uL (ref 150–400)
RBC: 3.84 MIL/uL — ABNORMAL LOW (ref 4.22–5.81)
RDW: 13 % (ref 11.5–15.5)
WBC: 6.7 10*3/uL (ref 4.0–10.5)
nRBC: 0 % (ref 0.0–0.2)

## 2020-09-12 LAB — GLUCOSE, CAPILLARY: Glucose-Capillary: 209 mg/dL — ABNORMAL HIGH (ref 70–99)

## 2020-09-12 IMAGING — CT CT BIOPSY AND ASPIRATION BONE MARROW
1 of 2 series · 9 of 14 positions shown, 12 images · non-contrast
Comparison: none

INDICATION: 64-year-old male with abnormal SPEP.

[Series 2: i-spiral 5.0 b30f · axial · 0.67mm/px · z∈[-95,-36]mm · 9 of 23 slices shown, 12 images]
[im 3/23  soft-tissue]
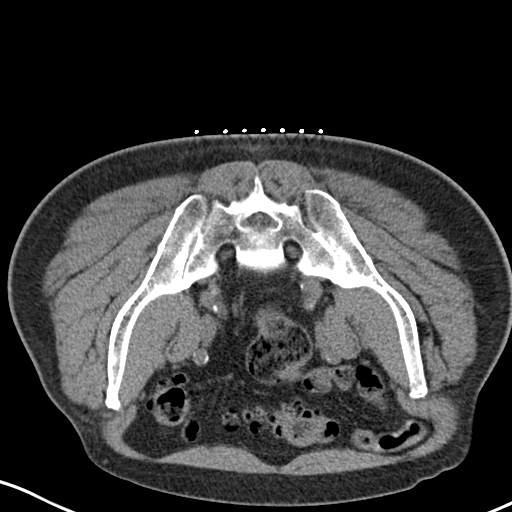
[im 3/23  bone]
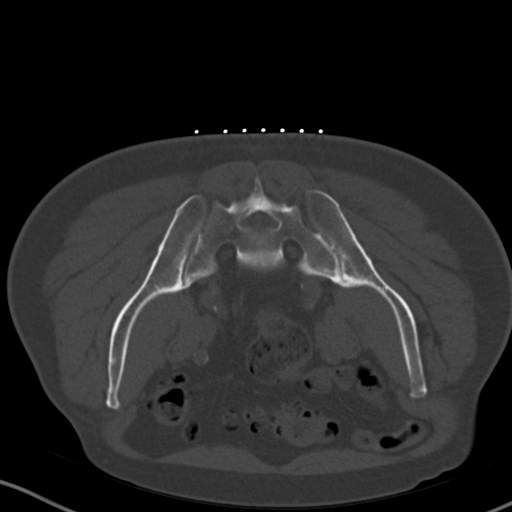
[im 5/23  bone]
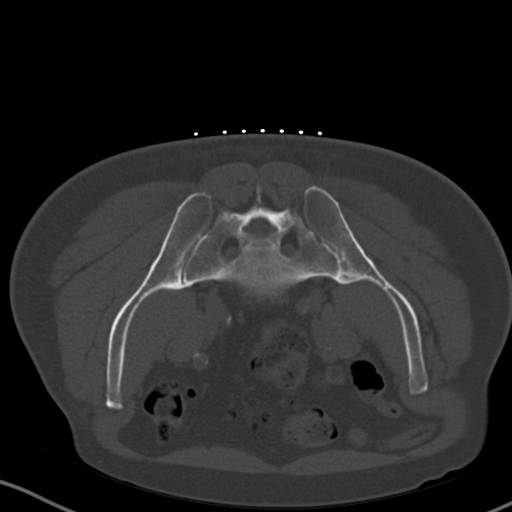
[im 7/23  bone]
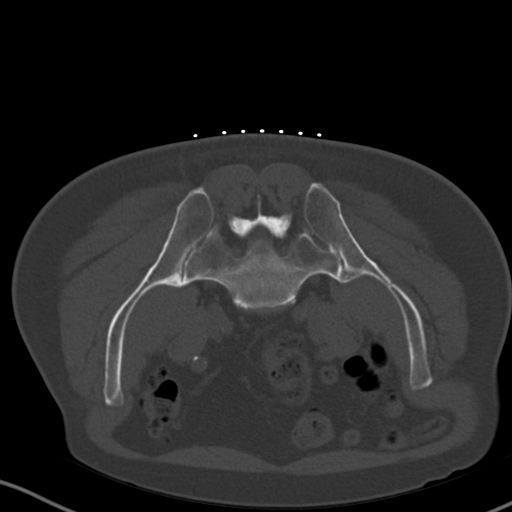
[im 9/23  bone]
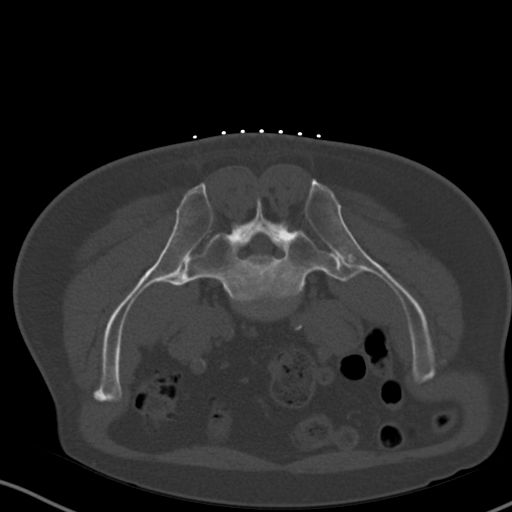
[im 12/23  soft-tissue]
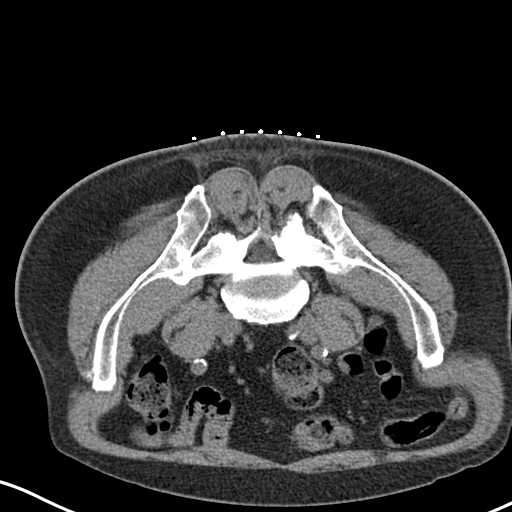
[im 12/23  bone]
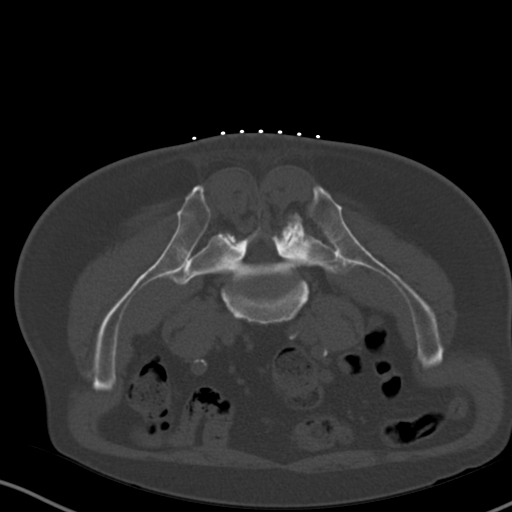
[im 14/23  bone]
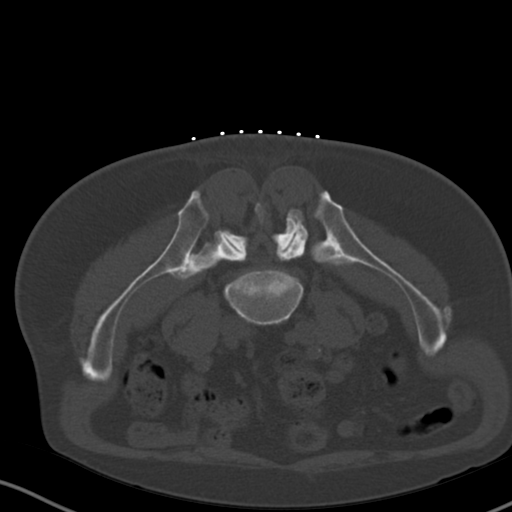
[im 16/23  bone]
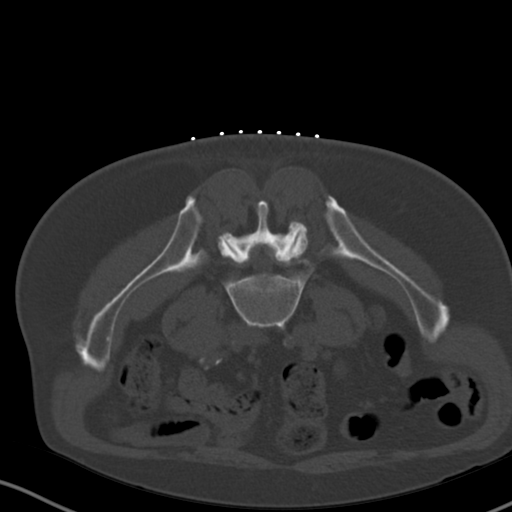
[im 18/23  bone]
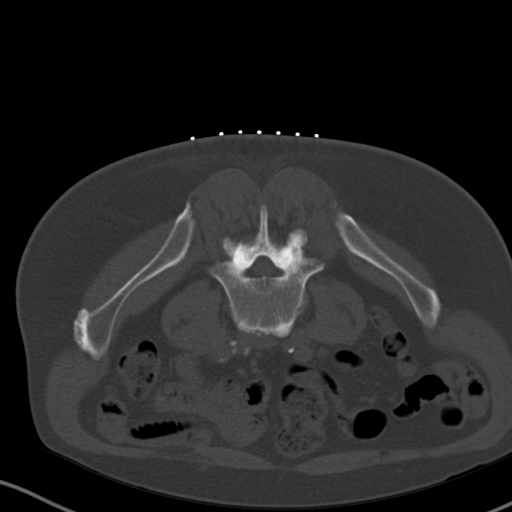
[im 20/23  soft-tissue]
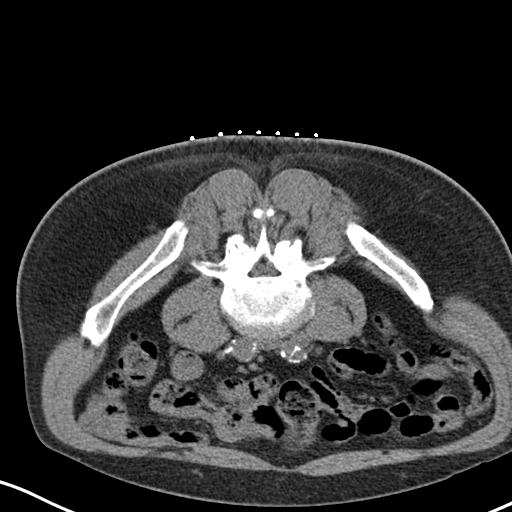
[im 20/23  bone]
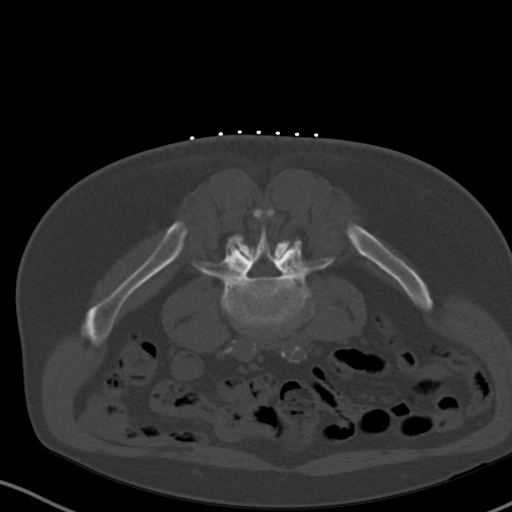

[9 of 14 positions shown; findings below may reference images not displayed]

EXAM:
CT GUIDED BONE MARROW ASPIRATION AND CORE BIOPSY

MEDICATIONS:
None.

ANESTHESIA/SEDATION:
Moderate (conscious) sedation was employed during this procedure. A
total of 1 milligrams versed and 50 micrograms fentanyl were
administered intravenously. The patient's level of consciousness and
vital signs were monitored continuously by radiology nursing
throughout the procedure under my direct supervision.

Total monitored sedation time: 9 minutes

FLUOROSCOPY TIME:  None.

COMPLICATIONS:
None immediate.

Estimated blood loss: <25 mL

PROCEDURE:
Informed written consent was obtained from the patient after a
thorough discussion of the procedural risks, benefits and
alternatives. All questions were addressed. Maximal Sterile Barrier
Technique was utilized including caps, mask, sterile gowns, sterile
gloves, sterile drape, hand hygiene and skin antiseptic. A timeout
was performed prior to the initiation of the procedure.

The patient was positioned prone and non-contrast localization CT
was performed of the pelvis to demonstrate the iliac marrow spaces.

Maximal barrier sterile technique utilized including caps, mask,
sterile gowns, sterile gloves, large sterile drape, hand hygiene,
and betadine prep.

Under sterile conditions and local anesthesia, an 11 gauge coaxial
bone biopsy needle was advanced into the right iliac marrow space.
Needle position was confirmed with CT imaging. Initially, bone
marrow aspiration was performed. Next, the 11 gauge outer cannula
was utilized to obtain a right iliac bone marrow core biopsy. Needle
was removed. Hemostasis was obtained with compression. The patient
tolerated the procedure well. Samples were prepared with the
cytotechnologist.
IMPRESSION: Technically successful CT-guided right iliac bone marrow aspiration
and core biopsy.

## 2020-09-12 MED ORDER — SODIUM CHLORIDE 0.9 % IV SOLN: INTRAVENOUS | Status: DC

## 2020-09-12 MED ORDER — FENTANYL CITRATE (PF) 100 MCG/2ML IJ SOLN
INTRAMUSCULAR | Status: AC
  Filled 2020-09-12: qty 2

## 2020-09-12 MED ORDER — HEPARIN SOD (PORK) LOCK FLUSH 100 UNIT/ML IV SOLN
INTRAVENOUS | Status: AC
  Filled 2020-09-12: qty 5

## 2020-09-12 MED ORDER — FENTANYL CITRATE (PF) 100 MCG/2ML IJ SOLN
INTRAMUSCULAR | Status: AC | PRN
Start: 2020-09-12 — End: 2020-09-12
  Administered 2020-09-12: 50 ug via INTRAVENOUS

## 2020-09-12 MED ORDER — MIDAZOLAM HCL 2 MG/2ML IJ SOLN
INTRAMUSCULAR | Status: AC | PRN
  Administered 2020-09-12: 1 mg via INTRAVENOUS

## 2020-09-12 MED ORDER — MIDAZOLAM HCL 2 MG/2ML IJ SOLN
INTRAMUSCULAR | Status: AC
  Filled 2020-09-12: qty 2

## 2020-09-12 NOTE — Progress Notes (Signed)
Patient clinically stable post BMB per Dr Laurence Ferrari, tolerated well. Vitals stable pre and post procedure. Awake/alert and oriented post procedure, wife to bedside. Received Versed 1 mg along with Fentanyl 50 mcg IV for procedure. Report given to [facility] RN post procedure with questions answered.

## 2020-09-12 NOTE — Procedures (Signed)
Interventional Radiology Procedure Note  Procedure: CT guided aspirate and core biopsy of right iliac bone Complications: None Recommendations: - Bedrest supine x 1 hrs - Hydrocodone PRN  Pain - Follow biopsy results  Signed,  Crespin Forstrom K. Alajiah Dutkiewicz, MD   
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
## 2020-09-14 ENCOUNTER — Other Ambulatory Visit: Payer: Self-pay

## 2020-09-14 ENCOUNTER — Ambulatory Visit
Admission: RE | Admit: 2020-09-14 | Discharge: 2020-09-14 | Disposition: A | Discharge status: Home / Self Care | Payer: Medicare HMO | Source: Ambulatory Visit | Attending: Nephrology | Admitting: Nephrology

## 2020-09-14 DIAGNOSIS — N1832 Chronic kidney disease, stage 3b: Secondary | ICD-10-CM | POA: Diagnosis not present

## 2020-09-14 DIAGNOSIS — R809 Proteinuria, unspecified: Secondary | ICD-10-CM | POA: Insufficient documentation

## 2020-09-14 IMAGING — US US RENAL
1 series · 14 of 25 positions shown · non-contrast
Comparison: None

CLINICAL DATA: Stage III B chronic kidney disease, proteinuria

EXAM:
RENAL / URINARY TRACT ULTRASOUND COMPLETE

[Series 1: us renal · 0.27mm/px · 14 of 36 slices shown]
[im 1/36]
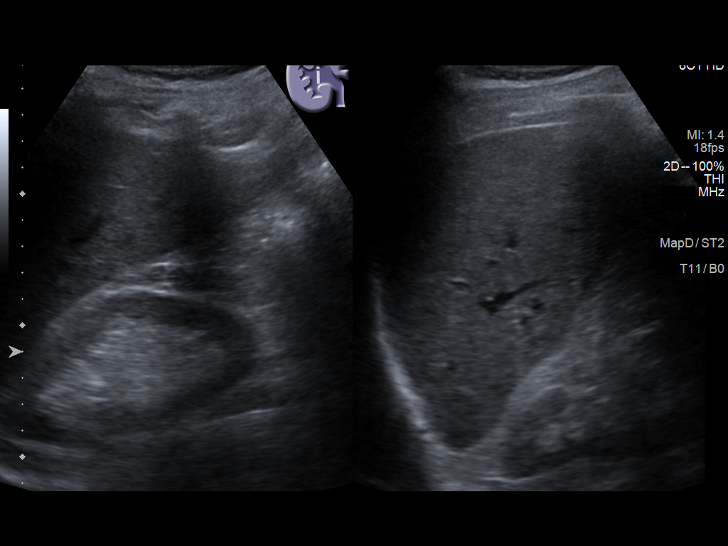
[im 3/36]
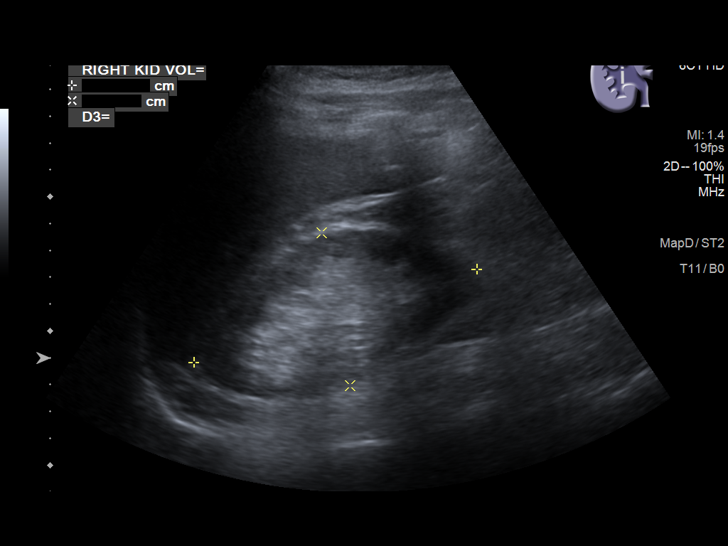
[im 6/36]
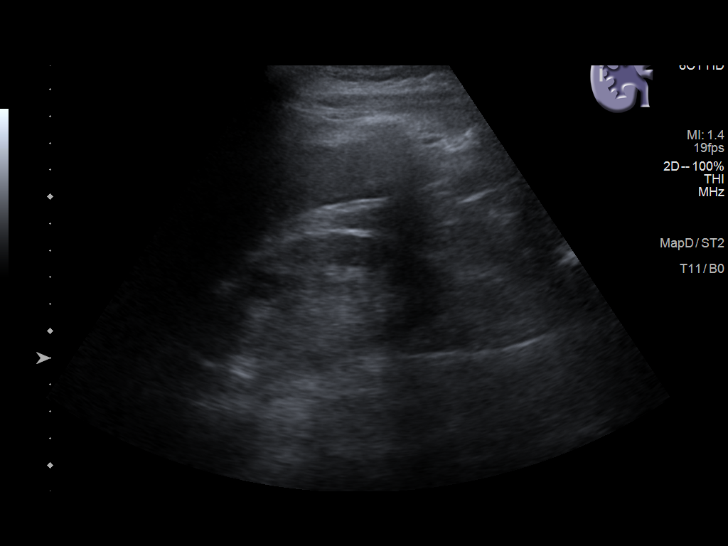
[im 9/36]
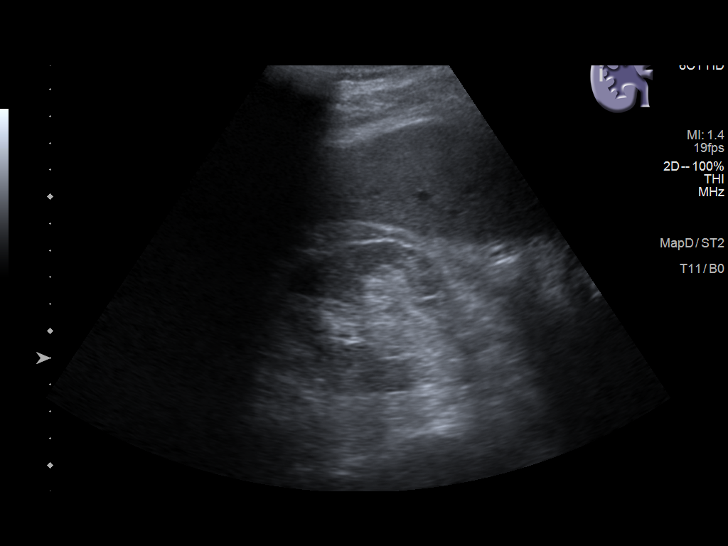
[im 12/36]
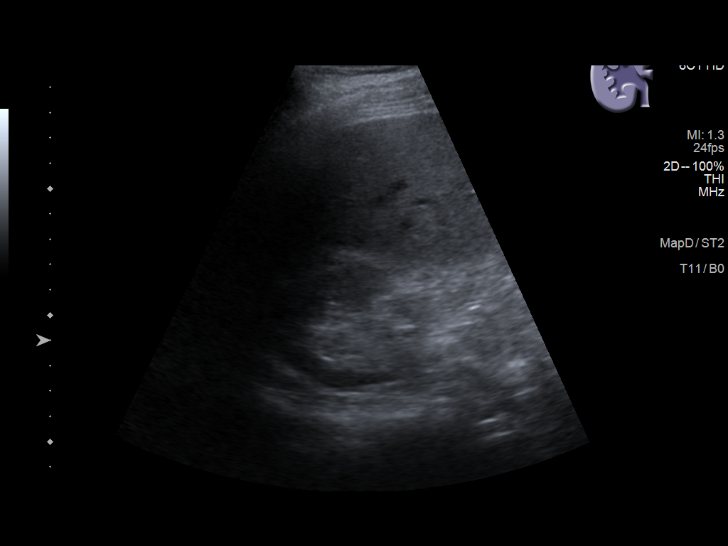
[im 14/36]
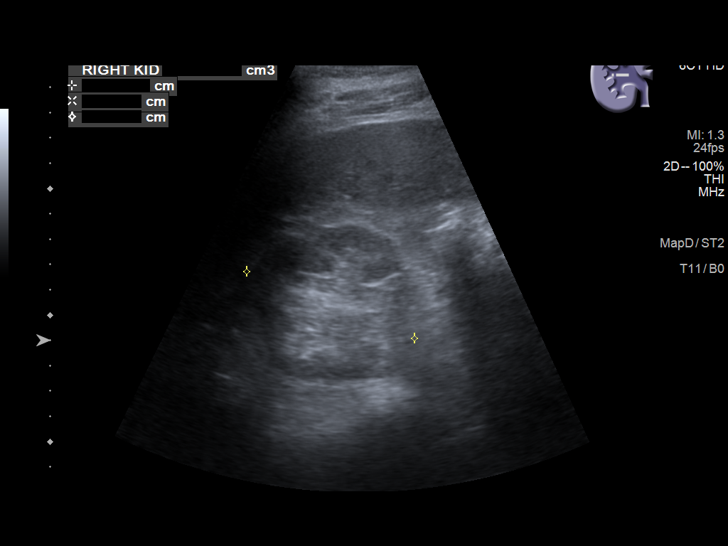
[im 17/36]
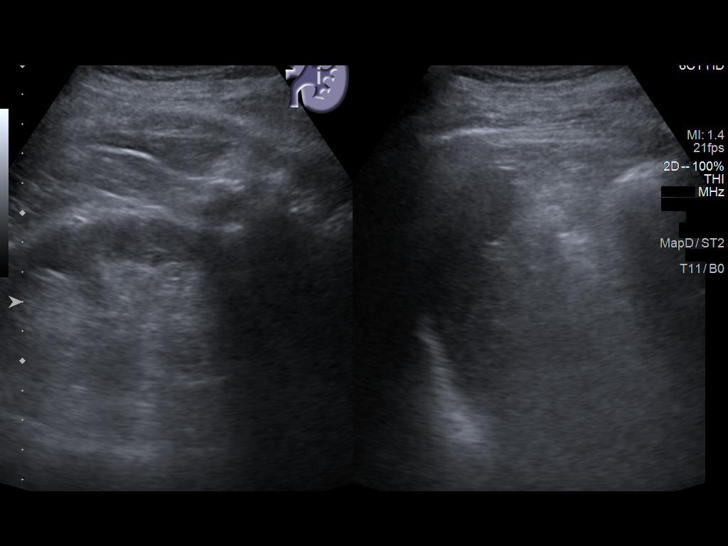
[im 19/36]
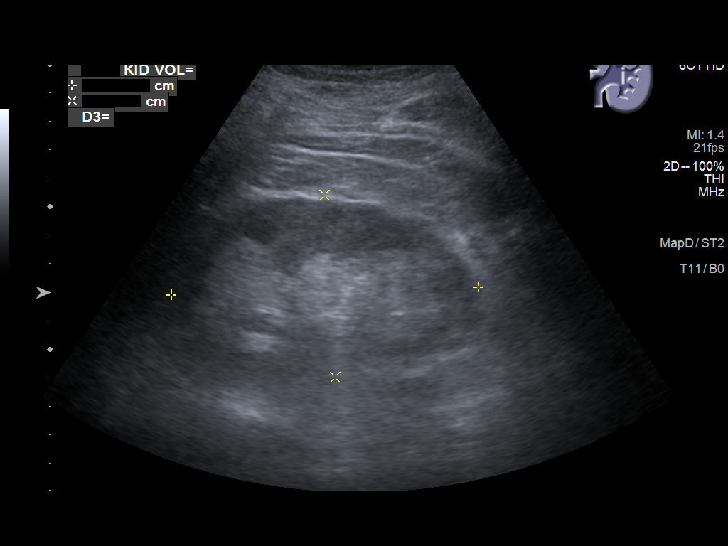
[im 22/36]
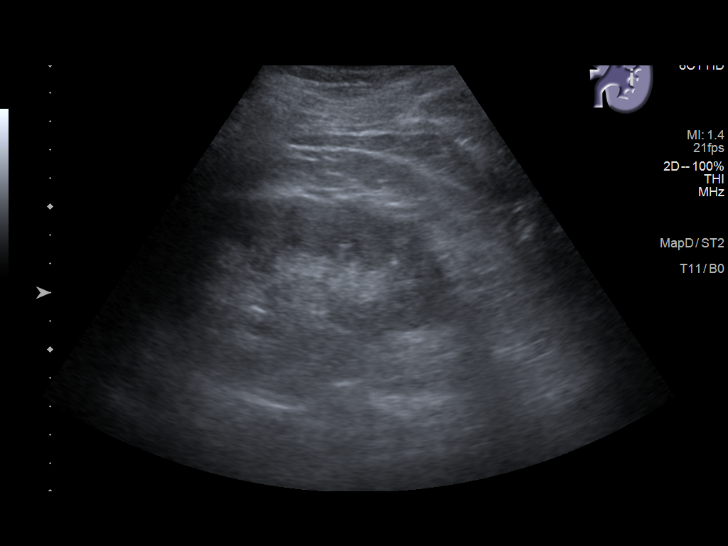
[im 24/36]
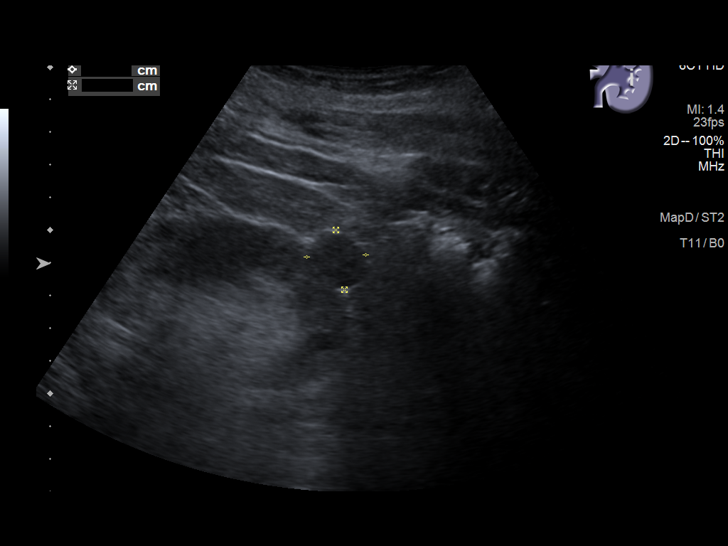
[im 27/36]
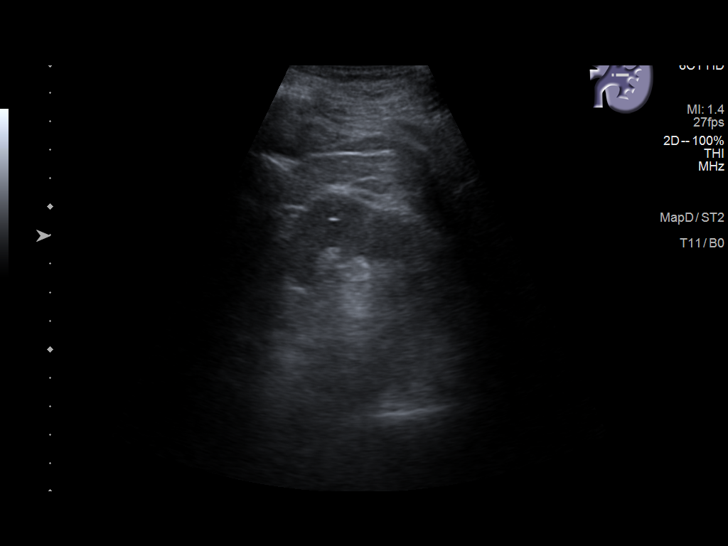
[im 30/36]
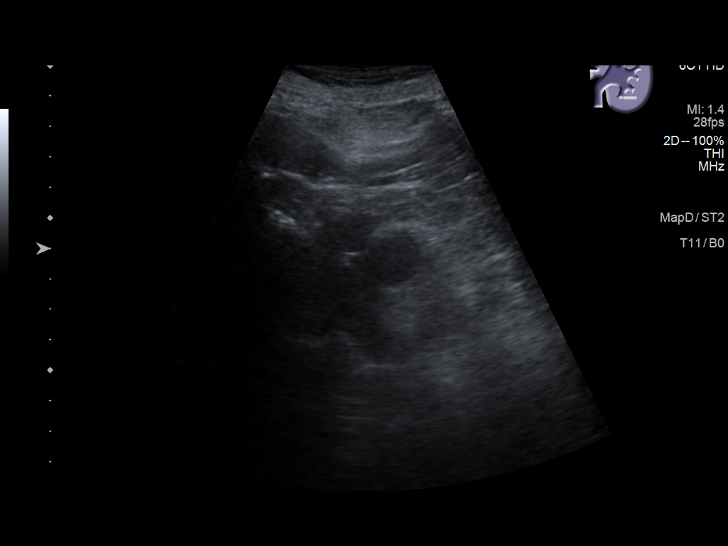
[im 33/36]
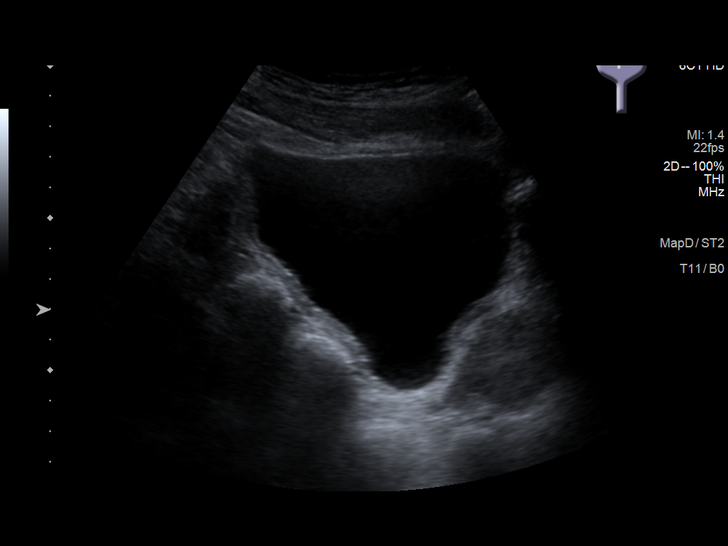
[im 36/36]
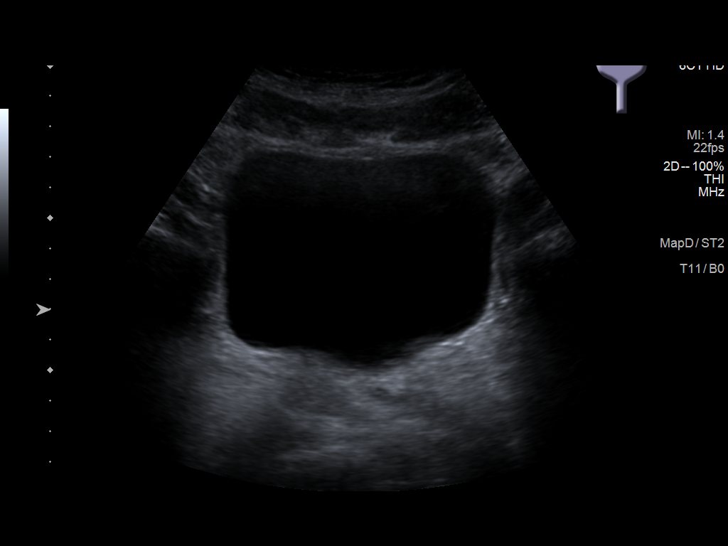

[14 of 25 positions shown; findings below may reference images not displayed]

FINDINGS: Right Kidney:

Renal measurements: 11.1 x 5.8 x 7.1 cm = volume: 240 mL. Marked
cortical thinning. Upper normal cortical echogenicity. Small cyst at
lateral upper RIGHT kidney 14 x 12 x 12 mm. No additional mass or
hydronephrosis. No shadowing calcification.

Left Kidney:

Renal measurements: 10.8 x 6.4 x 5.4 cm = volume: 195 mL. Cortical
thinning. Increased cortical echogenicity. Complicated hypoechoic
lesion identified at lower pole 19 x 18 x 13 mm, contains diffuse
low level internal echogenicity, question complex cystic versus
solid. No additional mass or hydronephrosis. No shadowing
calcification.

Bladder:

Appears normal for degree of bladder distention.

Other:

None.
IMPRESSION: Marked cortical thinning and medical renal disease changes of both
kidneys.

Complicated cystic versus solid mass at lower pole LEFT kidney 19 x
18 x 13 mm; characterization by MR imaging recommended.

## 2020-09-17 ENCOUNTER — Ambulatory Visit: Payer: Medicare HMO | Admitting: Oncology

## 2020-09-18 LAB — SURGICAL PATHOLOGY

## 2020-09-20 ENCOUNTER — Inpatient Hospital Stay: Payer: Medicare HMO

## 2020-09-20 ENCOUNTER — Encounter: Payer: Self-pay | Admitting: Oncology

## 2020-09-20 ENCOUNTER — Inpatient Hospital Stay: Payer: Medicare HMO | Admitting: Oncology

## 2020-09-20 VITALS — BP 170/85 | HR 72 | Temp 98.4°F | Resp 16 | Wt 204.2 lb

## 2020-09-20 DIAGNOSIS — C8590 Non-Hodgkin lymphoma, unspecified, unspecified site: Secondary | ICD-10-CM | POA: Diagnosis not present

## 2020-09-20 DIAGNOSIS — Z7189 Other specified counseling: Secondary | ICD-10-CM | POA: Diagnosis not present

## 2020-09-20 DIAGNOSIS — N289 Disorder of kidney and ureter, unspecified: Secondary | ICD-10-CM | POA: Diagnosis not present

## 2020-09-20 DIAGNOSIS — C880 Waldenstrom macroglobulinemia: Secondary | ICD-10-CM | POA: Insufficient documentation

## 2020-09-20 LAB — LACTATE DEHYDROGENASE: LDH: 108 U/L (ref 98–192)
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
## 2020-09-21 LAB — BETA 2 MICROGLOBULIN, SERUM: Beta-2 Microglobulin: 2.3 mg/L (ref 0.6–2.4)

## 2020-09-21 LAB — VISCOSITY, SERUM: Viscosity, Serum: 2 rel.saline (ref 1.4–2.1)

## 2020-09-24 ENCOUNTER — Encounter (HOSPITAL_COMMUNITY): Payer: Self-pay | Admitting: Oncology

## 2020-09-25 ENCOUNTER — Encounter (HOSPITAL_COMMUNITY): Payer: Self-pay | Admitting: Oncology

## 2020-09-25 LAB — SURGICAL PATHOLOGY

## 2020-10-01 ENCOUNTER — Other Ambulatory Visit: Payer: Self-pay

## 2020-10-01 ENCOUNTER — Ambulatory Visit
Admission: RE | Admit: 2020-10-01 | Discharge: 2020-10-01 | Disposition: A | Discharge status: Home / Self Care | Payer: Medicare HMO | Source: Ambulatory Visit | Attending: Oncology | Admitting: Oncology

## 2020-10-01 DIAGNOSIS — I700 Atherosclerosis of aorta: Secondary | ICD-10-CM | POA: Insufficient documentation

## 2020-10-01 DIAGNOSIS — N400 Enlarged prostate without lower urinary tract symptoms: Secondary | ICD-10-CM | POA: Insufficient documentation

## 2020-10-01 DIAGNOSIS — J439 Emphysema, unspecified: Secondary | ICD-10-CM | POA: Diagnosis not present

## 2020-10-01 DIAGNOSIS — K5730 Diverticulosis of large intestine without perforation or abscess without bleeding: Secondary | ICD-10-CM | POA: Diagnosis not present

## 2020-10-01 DIAGNOSIS — C8590 Non-Hodgkin lymphoma, unspecified, unspecified site: Secondary | ICD-10-CM | POA: Diagnosis not present

## 2020-10-01 DIAGNOSIS — E119 Type 2 diabetes mellitus without complications: Secondary | ICD-10-CM | POA: Insufficient documentation

## 2020-10-01 LAB — GLUCOSE, CAPILLARY: Glucose-Capillary: 148 mg/dL — ABNORMAL HIGH (ref 70–99)

## 2020-10-01 IMAGING — PT NM PET TUM IMG INITIAL (PI) SKULL BASE T - THIGH
1 of 9 series · 1 of 25 positions shown · non-contrast
Comparison: None.
COMPARISON: None.

Addendum:
CLINICAL DATA: Initial treatment strategy for non-Hodgkin's
lymphoma diagnosed on recent bone marrow biopsy.

EXAM:
NUCLEAR MEDICINE PET SKULL BASE TO THIGH
TECHNIQUE: 11.0 mCi F-18 FDG was injected intravenously. Full-ring PET imaging
was performed from the skull base to thigh after the radiotracer. CT
data was obtained and used for attenuation correction and anatomic
localization.
Fasting blood glucose: 148 mg/dl

[Series 3: ct wb 5.0 b30f · axial · 5.0mm · 0.98mm/px · 1 of 290 slices shown]
[im 290/290  brain]
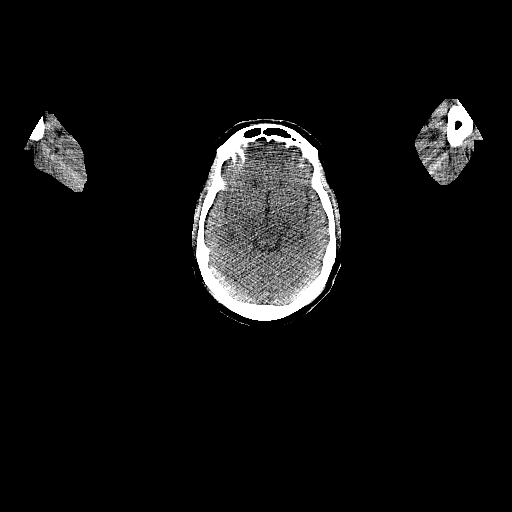

[1 of 25 positions shown; findings below may reference images not displayed]

FINDINGS: Mediastinal blood pool activity: SUV max

Liver activity: SUV max

NECK: No hypermetabolic lymph nodes in the neck.

Incidentally noted pituitary hypermetabolism (max SUV 11.0).

Incidental CT findings: none

CHEST: No enlarged or hypermetabolic axillary, mediastinal or hilar
lymph nodes. No hypermetabolic pulmonary findings.

Incidental CT findings: Atherosclerotic nonaneurysmal thoracic
aorta. Coronary atherosclerosis. Moderate centrilobular and
paraseptal emphysema with diffuse bronchial wall thickening. No
significant pulmonary nodules.

ABDOMEN/PELVIS: No abnormal hypermetabolic activity within the
liver, pancreas, adrenal glands, or spleen. No hypermetabolic lymph
nodes in the abdomen or pelvis.

Incidental CT findings: Normal size spleen. Marked diffuse colonic
diverticulosis. Mildly enlarged prostate. Atherosclerotic
nonaneurysmal abdominal aorta. Hyperdense exophytic 1.7 cm anterior
lower left renal cortical lesion without appreciable hypermetabolism
(series 3/image 168).

SKELETON: No focal hypermetabolic activity to suggest skeletal
metastasis.

Incidental CT findings: Asymmetric severe right hip osteoarthritis.
IMPRESSION: 1. No PET-CT evidence of metabolically active lymphoma. No
lymphadenopathy. Normal size spleen. No osseous lesions.
2. Incidental pituitary hypermetabolism. Suggest further evaluation
with pituitary protocol MRI brain without and with IV contrast.
3. Chronic findings include: Aortic Atherosclerosis ([HN]-[HN])
and Emphysema ([HN]-[HN]). Coronary atherosclerosis. Marked
diffuse colonic diverticulosis. Mildly enlarged prostate.

ADDENDUM:
MRI abdomen without and with IV contrast recommended for further
characterization of the indeterminate hyperdense exophytic 1.7 cm
anterior lower left renal cortical lesion, to exclude left renal
neoplasm.

The original report is otherwise unchanged.

*** End of Addendum ***
FINDINGS: Mediastinal blood pool activity: SUV max

Liver activity: SUV max

NECK: No hypermetabolic lymph nodes in the neck.

Incidentally noted pituitary hypermetabolism (max SUV 11.0).

Incidental CT findings: none

CHEST: No enlarged or hypermetabolic axillary, mediastinal or hilar
lymph nodes. No hypermetabolic pulmonary findings.

Incidental CT findings: Atherosclerotic nonaneurysmal thoracic
aorta. Coronary atherosclerosis. Moderate centrilobular and
paraseptal emphysema with diffuse bronchial wall thickening. No
significant pulmonary nodules.

ABDOMEN/PELVIS: No abnormal hypermetabolic activity within the
liver, pancreas, adrenal glands, or spleen. No hypermetabolic lymph
nodes in the abdomen or pelvis.

Incidental CT findings: Normal size spleen. Marked diffuse colonic
diverticulosis. Mildly enlarged prostate. Atherosclerotic
nonaneurysmal abdominal aorta. Hyperdense exophytic 1.7 cm anterior
lower left renal cortical lesion without appreciable hypermetabolism
(series 3/image 168).

SKELETON: No focal hypermetabolic activity to suggest skeletal
metastasis.

Incidental CT findings: Asymmetric severe right hip osteoarthritis.
IMPRESSION: 1. No PET-CT evidence of metabolically active lymphoma. No
lymphadenopathy. Normal size spleen. No osseous lesions.
2. Incidental pituitary hypermetabolism. Suggest further evaluation
with pituitary protocol MRI brain without and with IV contrast.
3. Chronic findings include: Aortic Atherosclerosis ([HN]-[HN])
and Emphysema ([HN]-[HN]). Coronary atherosclerosis. Marked
diffuse colonic diverticulosis. Mildly enlarged prostate.

## 2020-10-01 MED ORDER — FLUDEOXYGLUCOSE F - 18 (FDG) INJECTION
10.6000 | Freq: Once | INTRAVENOUS | Status: AC | PRN
  Administered 2020-10-01: 10.95 via INTRAVENOUS

## 2020-10-05 ENCOUNTER — Telehealth: Payer: Self-pay

## 2020-10-05 DIAGNOSIS — N289 Disorder of kidney and ureter, unspecified: Secondary | ICD-10-CM

## 2020-10-05 DIAGNOSIS — E237 Disorder of pituitary gland, unspecified: Secondary | ICD-10-CM

## 2020-10-05 NOTE — Telephone Encounter (Signed)
MRI's ordered. Please schedule patient for MRI abdomen & brain in January (pt prefers Thursday or Friday). If they can be done the same day that would be great. Please call pt with appt details.

## 2020-10-05 NOTE — Telephone Encounter (Signed)
-----   Message from Earlie Server, MD sent at 10/04/2020  4:19 PM EST ----- Please schedule him to do MRI abdomen w/wo contrast - kidney lesion.  Also arrange MRI brain pituitary protocol - incidental pituitary hypermetabolism.  I called him and communicated with him the PET and further recommendation. He will keep his appt with me at the end of Dec.  He prefers to do above MRIs in January, prefers Thursday or Friday.  I hope he can do both study on same day.

## 2020-10-05 NOTE — Telephone Encounter (Signed)
Done MRI and MD F/U appt has been scheduled as requested Pt was made aware A NEW appt reminder letter will be mailed out

## 2020-10-05 NOTE — Telephone Encounter (Signed)
Pt scheduled for tumor board on 12/28, not to see Dr. Tasia Catchings. Please schedule patient for MD follow up after MRIs and call pt with appts. Thanks

## 2020-10-06 ENCOUNTER — Encounter: Payer: Self-pay | Admitting: Oncology

## 2020-10-06 DIAGNOSIS — Z7189 Other specified counseling: Secondary | ICD-10-CM | POA: Insufficient documentation

## 2020-10-18 ENCOUNTER — Ambulatory Visit: Payer: Medicare HMO

## 2020-10-18 ENCOUNTER — Other Ambulatory Visit: Payer: Medicare HMO

## 2020-11-08 ENCOUNTER — Ambulatory Visit
Admission: RE | Admit: 2020-11-08 | Discharge: 2020-11-08 | Disposition: A | Discharge status: Home / Self Care | Payer: Medicare HMO | Source: Ambulatory Visit | Attending: Oncology | Admitting: Oncology

## 2020-11-08 ENCOUNTER — Other Ambulatory Visit: Payer: Self-pay

## 2020-11-08 DIAGNOSIS — N289 Disorder of kidney and ureter, unspecified: Secondary | ICD-10-CM | POA: Insufficient documentation

## 2020-11-08 DIAGNOSIS — E237 Disorder of pituitary gland, unspecified: Secondary | ICD-10-CM | POA: Diagnosis present

## 2020-11-08 IMAGING — MR MR ABDOMEN WO/W CM
18 series · 48 of 48 positions shown · IV contrast (9ml Gadavist)
Comparison: PET-CT [DATE] and ultrasound [DATE]

CLINICAL DATA: Renal lesions seen on ultrasound and PET-CT.

EXAM:
MRI ABDOMEN WITHOUT AND WITH CONTRAST
TECHNIQUE: Multiplanar multisequence MR imaging of the abdomen was performed
both before and after the administration of intravenous contrast.
CONTRAST:  9mL GADAVIST GADOBUTROL 1 MMOL/ML IV SOLN

[Series 2: cor haste · coronal · 6.0mm · 1.19mm/px · 2 of 32 slices shown]
[im 1/32]
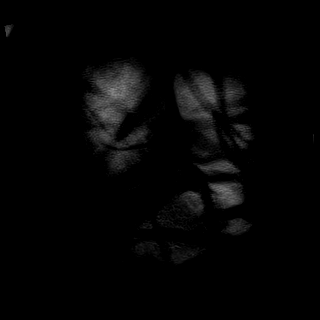
[im 32/32]
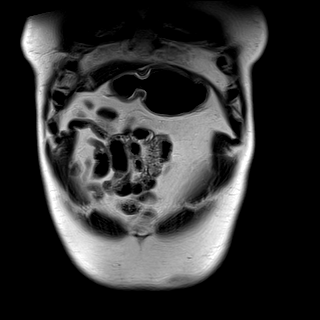

[Series 4: T2 fat-sat · axial · 6.0mm · 1.19mm/px · z∈[-73,+150]mm · 2 of 32 slices shown]
[im 1/32]
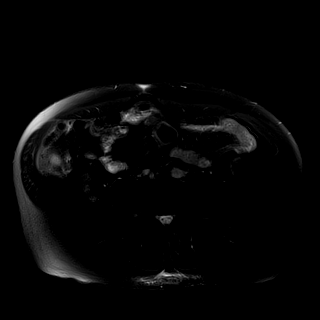
[im 32/32]
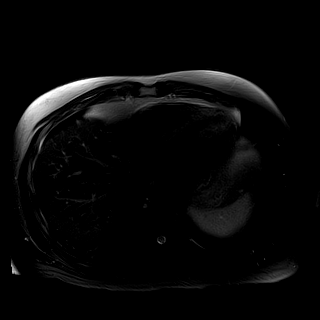

[Series 6: DWI · axial · 6.0mm · 1.42mm/px · z∈[-73,+150]mm · 5 of 96 slices shown (1 of 2)]
[im 1/96]
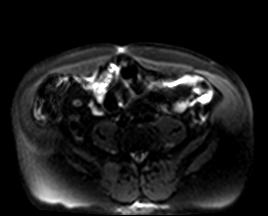
[im 24/96]
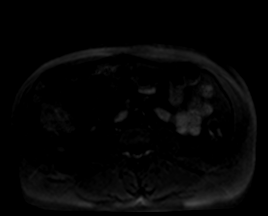
[im 48/96]
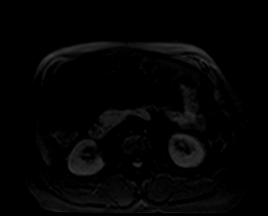
[im 72/96]
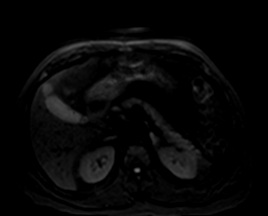
[im 96/96]
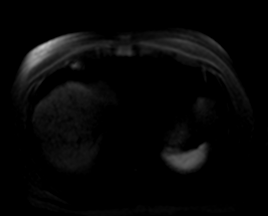

[Series 7: DWI · axial · 6.0mm · 1.42mm/px · 1 of 32 slices shown (2 of 2)]
[im 1/32]
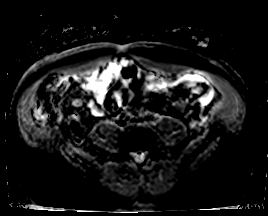

[Series 8: ax in & · axial · 3.0mm · 1.19mm/px · z∈[-68,+145]mm · 3 of 72 slices shown (1 of 2)]
[im 1/72]
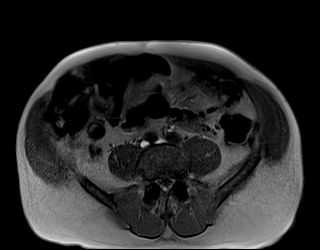
[im 36/72]
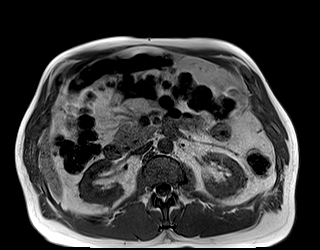
[im 72/72]
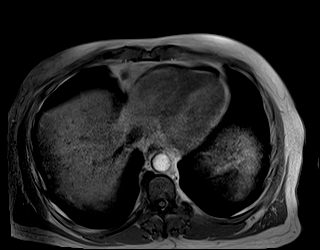

[Series 8: ax in & · axial · 3.0mm · 1.19mm/px · z∈[-68,+145]mm · 3 of 72 slices shown (2 of 2)]
[im 1/72]
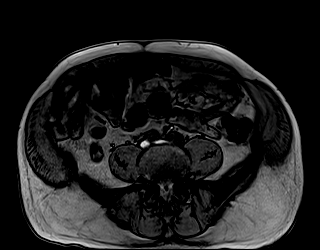
[im 36/72]
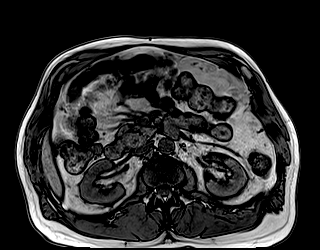
[im 72/72]
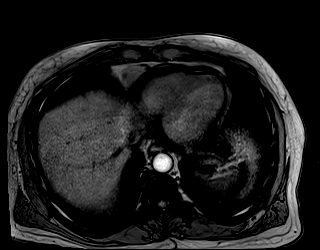

[Series 9: bSSFP · axial · 6.0mm · 0.74mm/px · 1 of 32 slices shown]
[im 1/32]
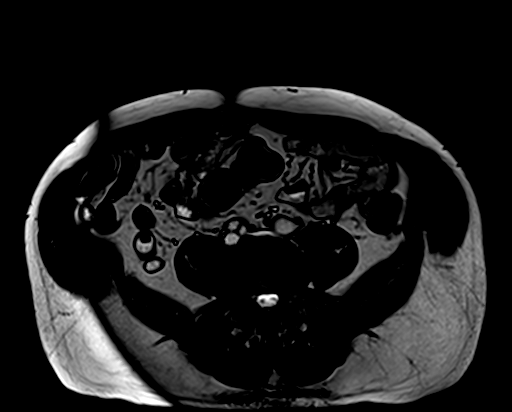

[Series 10: T1 dynamic · axial · non-contrast · 3.0mm · 1.19mm/px · z∈[-66,+147]mm · 3 of 72 slices shown (1 of 9)]
[im 1/72]
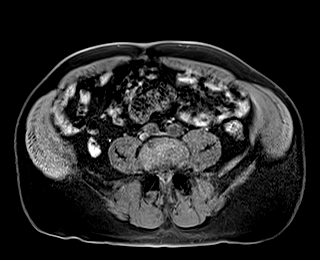
[im 36/72]
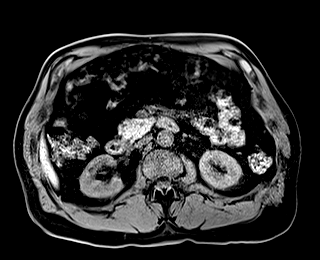
[im 72/72]
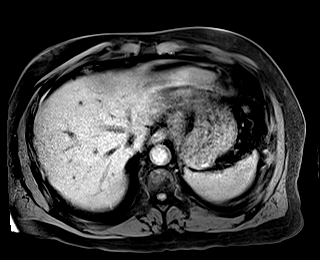

[Series 11: T1 dynamic · axial · 3.0mm · 1.19mm/px · z∈[-66,+147]mm · 3 of 72 slices shown (2 of 9)]
[im 1/72]
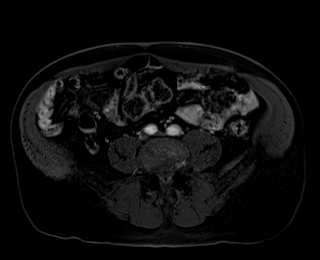
[im 36/72]
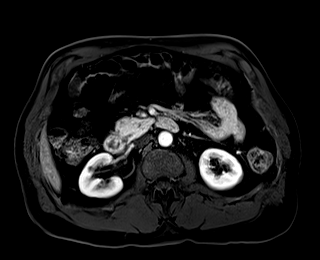
[im 72/72]
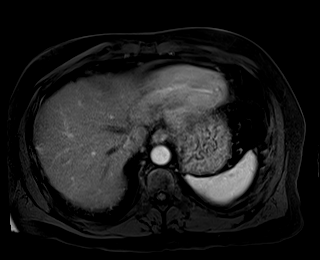

[Series 12: T1 dynamic · axial · 3.0mm · 1.19mm/px · z∈[-66,+147]mm · 3 of 72 slices shown (3 of 9)]
[im 1/72]
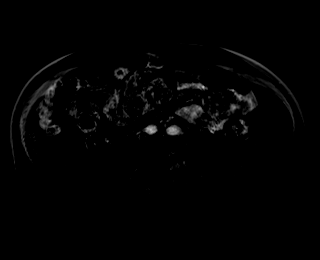
[im 36/72]
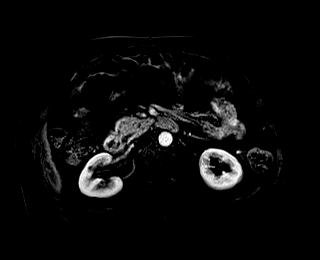
[im 72/72]
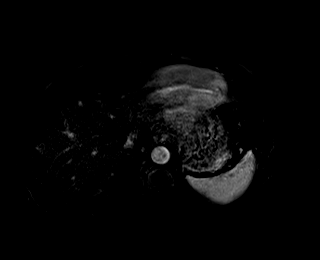

[Series 13: T1 dynamic · axial · 3.0mm · 1.19mm/px · z∈[-66,+147]mm · 3 of 72 slices shown (4 of 9)]
[im 1/72]
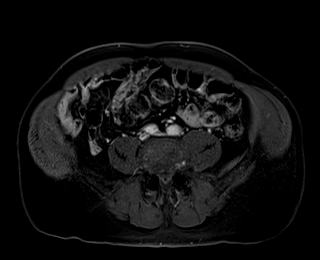
[im 36/72]
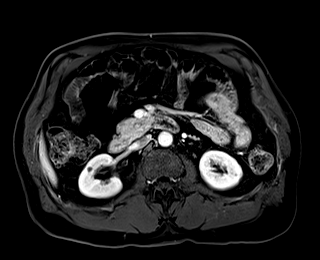
[im 72/72]
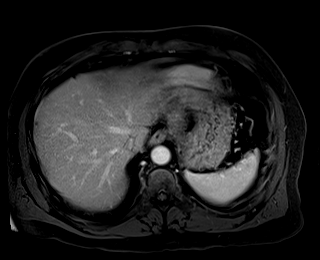

[Series 14: T1 dynamic · axial · 3.0mm · 1.19mm/px · z∈[-66,+147]mm · 3 of 72 slices shown (5 of 9)]
[im 1/72]
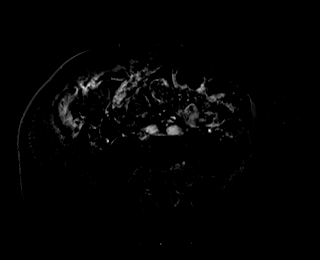
[im 36/72]
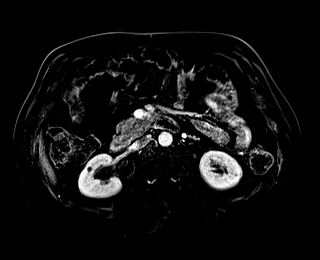
[im 72/72]
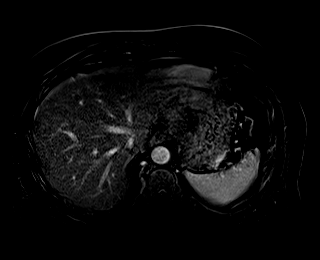

[Series 15: T1 dynamic · axial · 3.0mm · 1.19mm/px · z∈[-66,+147]mm · 3 of 72 slices shown (6 of 9)]
[im 1/72]
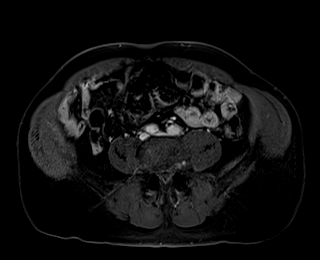
[im 36/72]
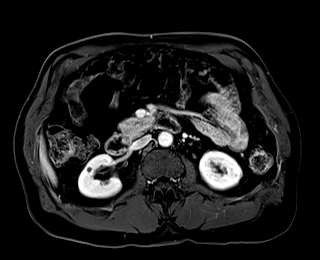
[im 72/72]
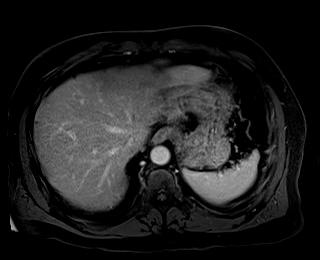

[Series 16: T1 dynamic · axial · 3.0mm · 1.19mm/px · z∈[-66,+147]mm · 3 of 72 slices shown (7 of 9)]
[im 1/72]
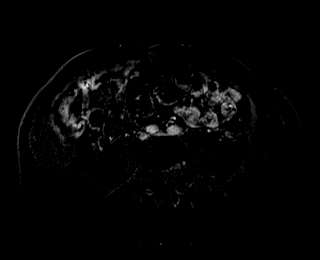
[im 36/72]
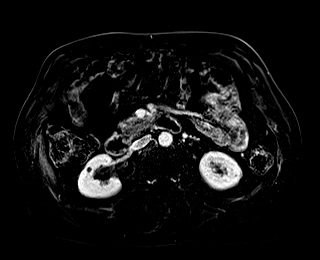
[im 72/72]
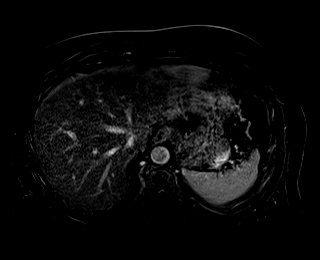

[Series 17: T1 dynamic post-contrast · coronal · 3.0mm · 1.31mm/px · 3 of 80 slices shown]
[im 1/80]
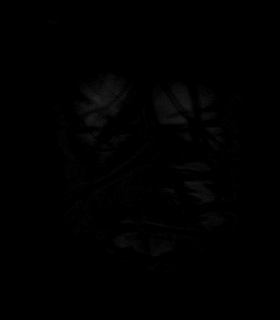
[im 40/80]
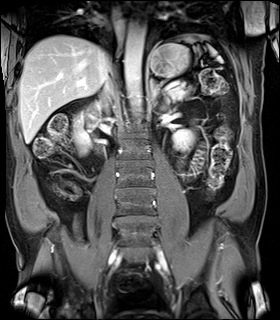
[im 80/80]
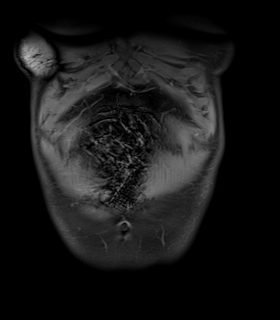

[Series 18: T2 · axial · 6.0mm · 1.19mm/px · 1 of 32 slices shown]
[im 1/32]
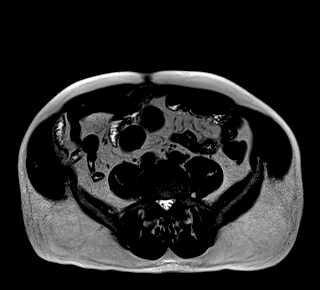

[Series 19: T1 dynamic · axial · 3.0mm · 1.19mm/px · z∈[-66,+147]mm · 3 of 72 slices shown (8 of 9)]
[im 1/72]
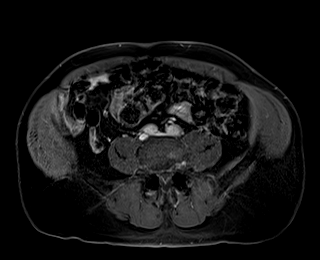
[im 36/72]
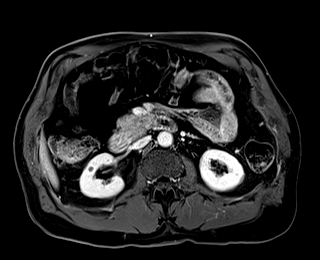
[im 72/72]
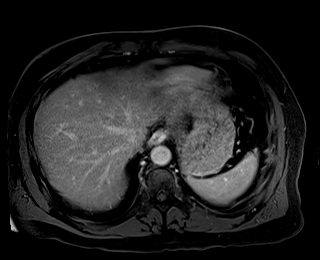

[Series 20: T1 dynamic · axial · 3.0mm · 1.19mm/px · z∈[-66,+147]mm · 3 of 72 slices shown (9 of 9)]
[im 1/72]
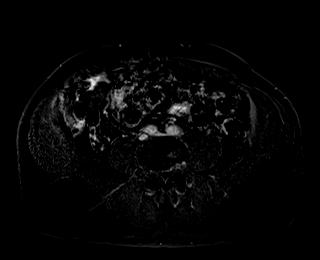
[im 36/72]
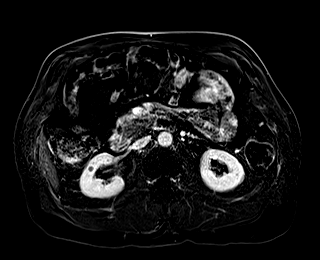
[im 72/72]
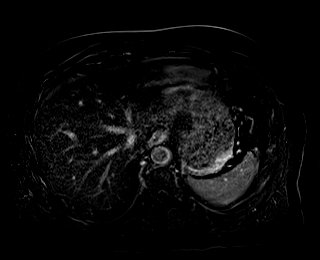

[48 of 48 positions shown; findings below may reference images not displayed]

FINDINGS: Lower chest: The lung bases are clear of an acute process. No
pulmonary lesions or pleural or pericardial effusion.

Hepatobiliary: Small scattered hepatic cysts are noted. No worrisome
hepatic lesions or intrahepatic biliary dilatation. The gallbladder
is unremarkable. Normal caliber and course the common bile duct.

Pancreas:  No mass, inflammation or ductal dilatation.

Spleen:  Normal size.  No focal lesions.

Adrenals/Urinary Tract:  The adrenal glands are unremarkable.

There are small simple nonenhancing renal cysts bilaterally. The 16
mm lesion associated with the lower pole region of the left kidney
laterally has increased T1 signal intensity and decreased T2 signal
intensity and does not demonstrate any contrast enhancement.
Findings consistent with a benign hemorrhagic cyst. No worrisome
renal lesions.

Stomach/Bowel: Visualized portions within the abdomen are
unremarkable.

Vascular/Lymphatic: The aorta and branch vessels are patent. The
major venous structures are patent. No aneurysm or dissection. The
major venous structures are unremarkable. No mesenteric or
retroperitoneal mass or adenopathy.

Other:  No ascites or abdominal wall hernia.

Musculoskeletal: No significant bony findings.
IMPRESSION: 1. Benign 16 mm hemorrhagic cyst associated with the lower pole
region of the left kidney. No worrisome renal lesions.
2. Small scattered renal and hepatic cysts.
3. No acute abdominal findings or adenopathy.

## 2020-11-08 MED ORDER — GADOBUTROL 1 MMOL/ML IV SOLN
9.0000 mL | Freq: Once | INTRAVENOUS | Status: AC | PRN
  Administered 2020-11-08: 9 mL via INTRAVENOUS

## 2020-11-10 ENCOUNTER — Ambulatory Visit
Admission: RE | Admit: 2020-11-10 | Discharge: 2020-11-10 | Disposition: A | Discharge status: Home / Self Care | Payer: Medicare HMO | Source: Ambulatory Visit | Attending: Oncology | Admitting: Oncology

## 2020-11-10 ENCOUNTER — Other Ambulatory Visit: Payer: Self-pay

## 2020-11-10 DIAGNOSIS — E237 Disorder of pituitary gland, unspecified: Secondary | ICD-10-CM | POA: Insufficient documentation

## 2020-11-10 IMAGING — MR MR HEAD WO/W CM
12 of 18 series · 27 of 48 positions shown · IV contrast (gadavist)
Comparison: Head CT [DATE]

CLINICAL DATA: Pituitary protocol. Incidental pituitary
hypermetabolism.

EXAM:
MRI HEAD WITHOUT AND WITH CONTRAST
TECHNIQUE: Multiplanar, multiecho pulse sequences of the brain and surrounding
structures were obtained without and with intravenous contrast.
CONTRAST:  9mL GADAVIST GADOBUTROL 1 MMOL/ML IV SOLN

[Series 5: T1 · sagittal · 5.0mm · 0.62mm/px · 2 of 23 slices shown]
[im 1/23]
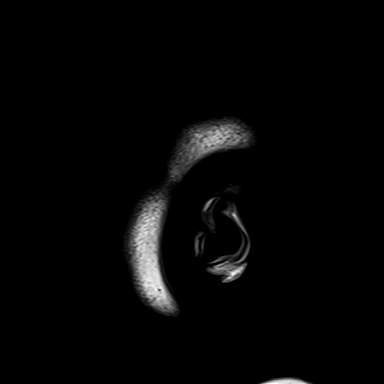
[im 12/23]
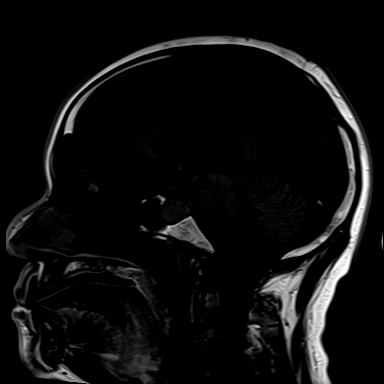

[Series 8: T2 · axial · 5.0mm · 0.53mm/px · z∈[-63,+80]mm · 2 of 25 slices shown]
[im 1/25]
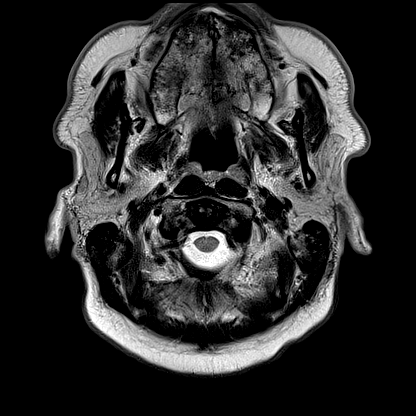
[im 25/25]
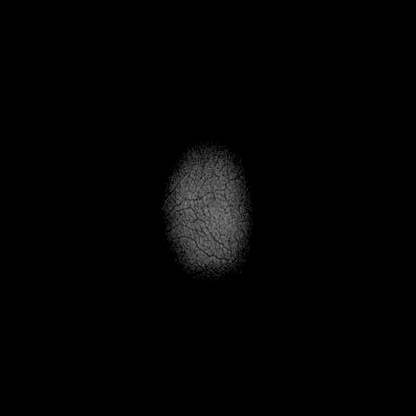

[Series 10: FLAIR · axial · 3.0mm · 0.53mm/px · z∈[-75,+85]mm · 5 of 55 slices shown]
[im 1/55]
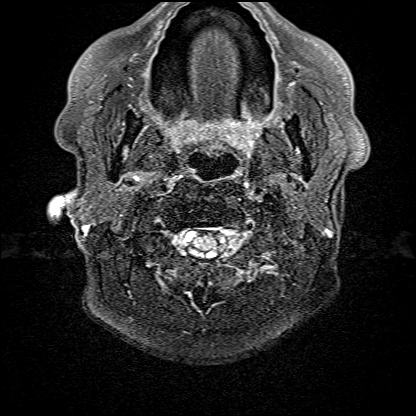
[im 14/55]
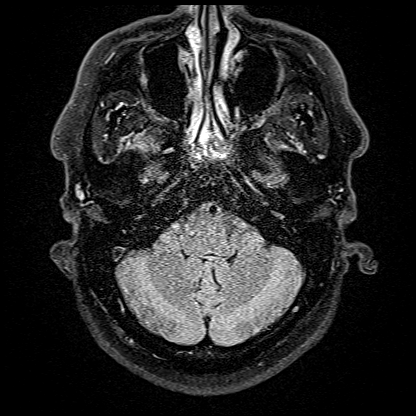
[im 28/55]
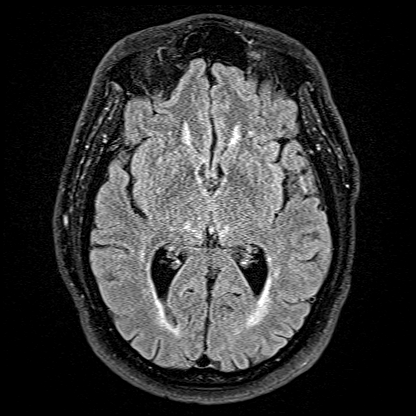
[im 41/55]
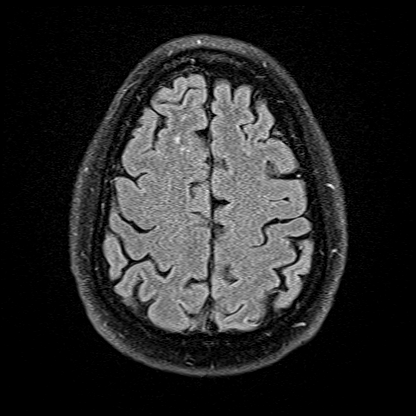
[im 55/55]
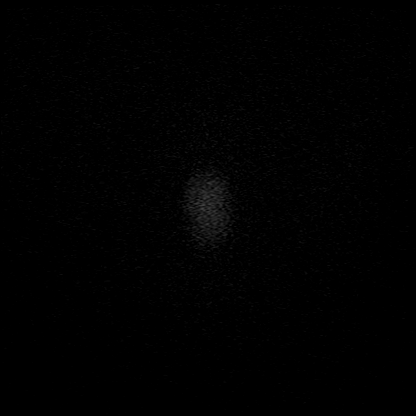

[Series 14: T1 post-contrast · coronal · 3.0mm · 0.28mm/px · 1 of 11 slices shown (1 of 9)]
[im 1/11]
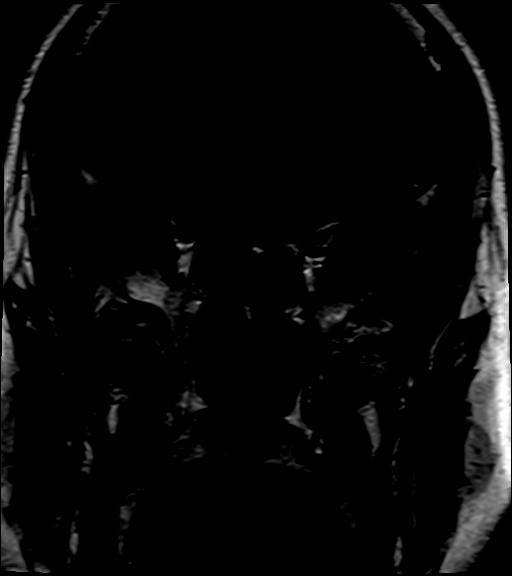

[Series 15: T1 post-contrast · coronal · 3.0mm · 0.28mm/px · 1 of 11 slices shown (2 of 9)]
[im 1/11]
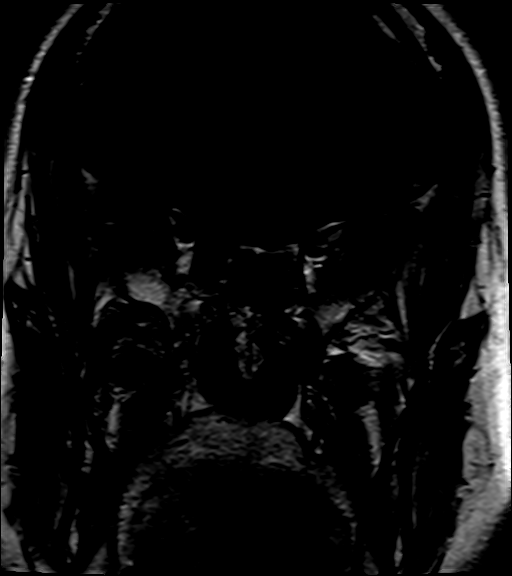

[Series 16: T1 post-contrast · coronal · 3.0mm · 0.28mm/px · 1 of 11 slices shown (3 of 9)]
[im 1/11]
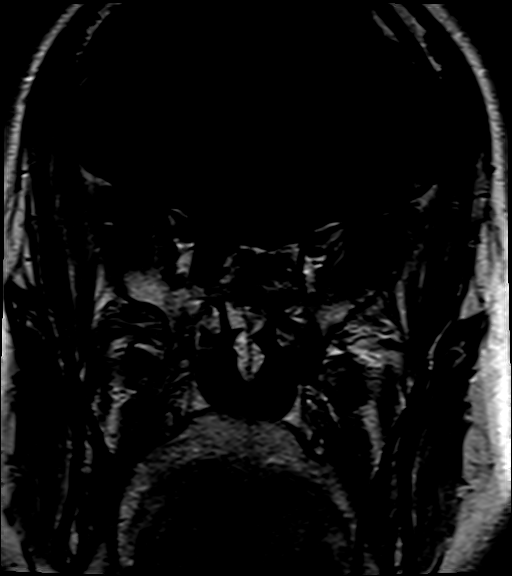

[Series 17: T1 post-contrast · coronal · 3.0mm · 0.28mm/px · 1 of 11 slices shown (4 of 9)]
[im 1/11]
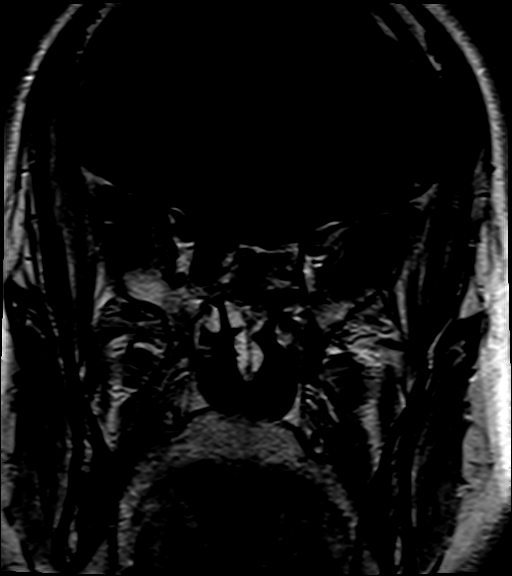

[Series 18: T1 post-contrast · coronal · 3.0mm · 0.28mm/px · 1 of 11 slices shown (5 of 9)]
[im 1/11]
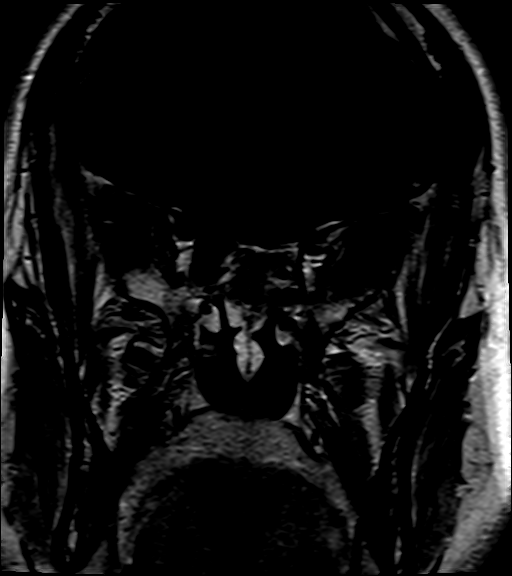

[Series 19: T1 post-contrast · coronal · 3.0mm · 0.21mm/px · 1 of 13 slices shown (6 of 9)]
[im 1/13]
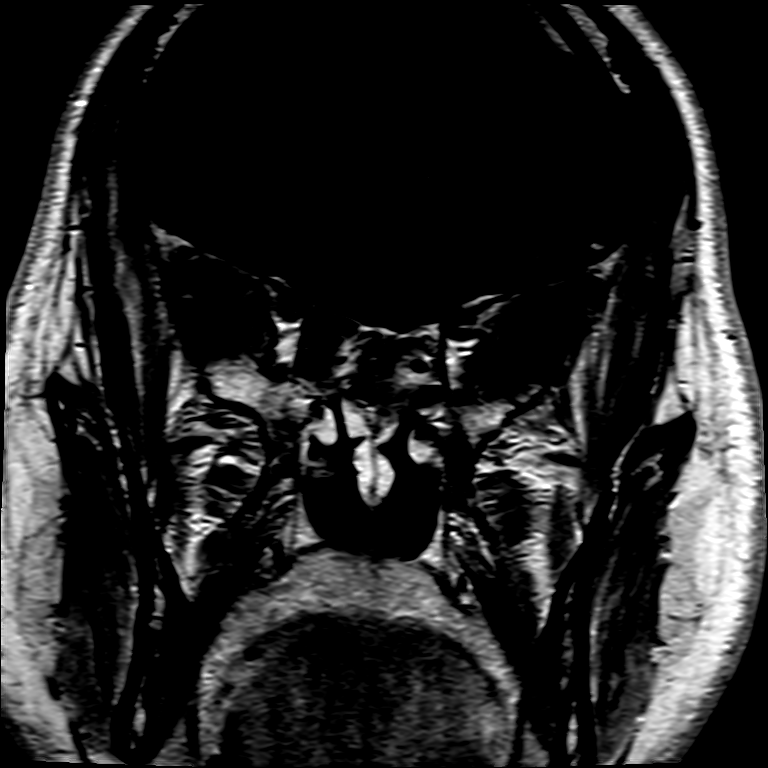

[Series 20: T1 post-contrast · sagittal · 3.0mm · 0.21mm/px · 1 of 13 slices shown (7 of 9)]
[im 1/13]
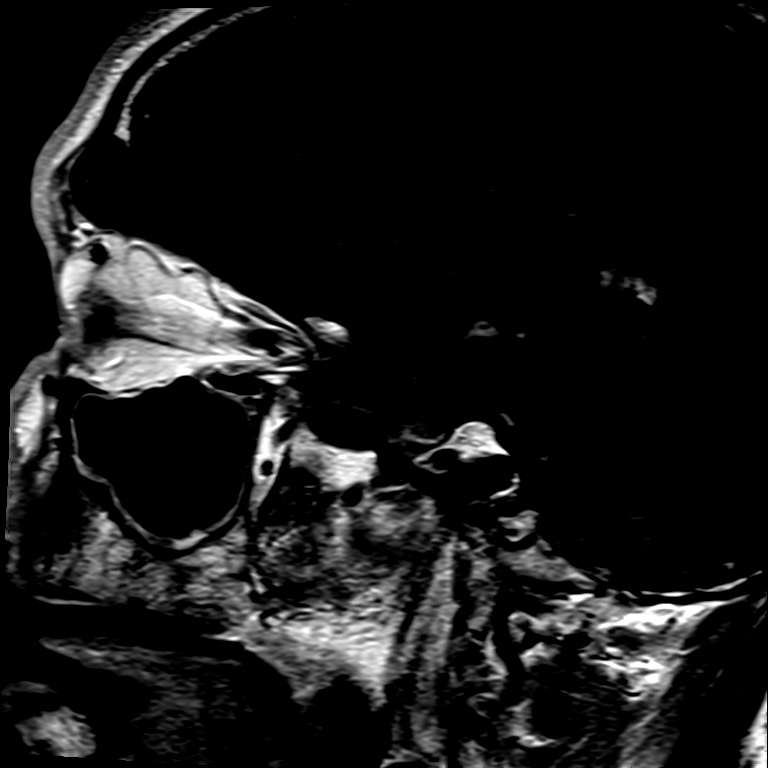

[Series 21: T1 post-contrast · axial · 1.0mm · 0.98mm/px · z∈[-81,+92]mm · 8 of 176 slices shown (8 of 9)]
[im 1/176]
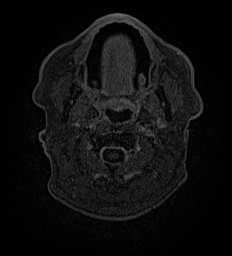
[im 26/176]
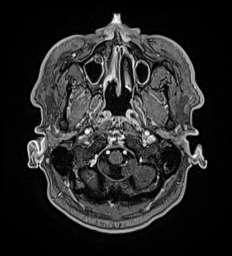
[im 51/176]
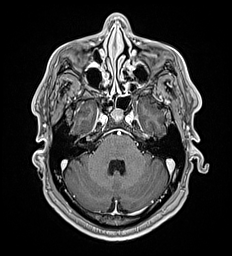
[im 76/176]
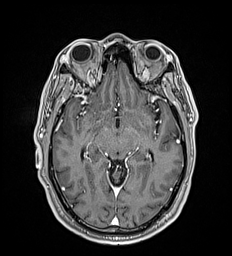
[im 101/176]
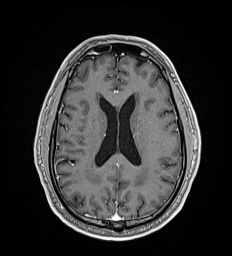
[im 126/176]
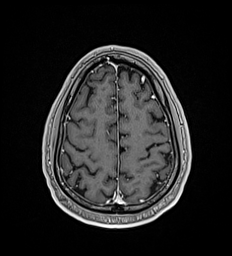
[im 151/176]
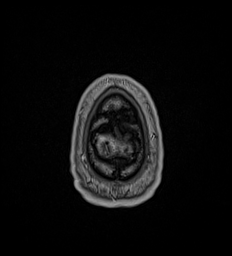
[im 176/176]
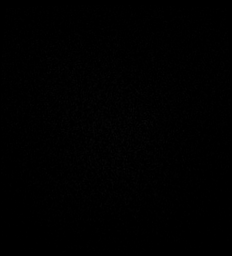

[Series 22: T1 post-contrast · coronal · 5.0mm · 0.57mm/px · 3 of 29 slices shown (9 of 9)]
[im 1/29]
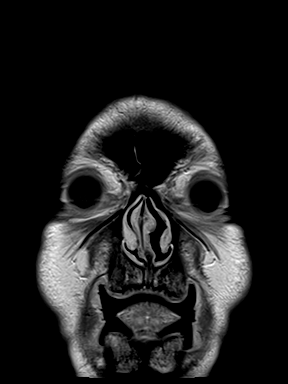
[im 15/29]
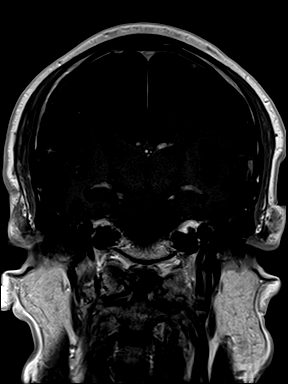
[im 29/29]
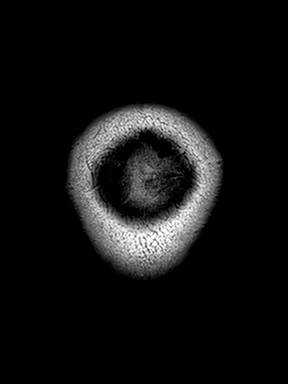

[27 of 48 positions shown; findings below may reference images not displayed]

FINDINGS: Brain: 15 mm enhancing mass in the expanded sella, new from
comparison head CT. The mass is indistinguishable from the pituitary
gland which appears posteriorly displaced along with the non
thickened infundibulum. No evidence of cavernous sinus invasion. No
suprasellar cistern involvement or chiasmatic impingement.

Brain shows chronic small vessel ischemia with moderate FLAIR
hyperintensity in the cerebral white matter. Chronic lacune is are
seen in the bilateral thalamus. Small remote cerebellar infarcts and
chronic small vessel disease in the pons.

No acute infarct, hemorrhage, hydrocephalus, or collection.

Vascular: Normal flow voids and vascular enhancements

Skull and upper cervical spine: Normal marrow signal.

Sinuses/Orbits: Minor mucosal thickening in the paranasal sinuses.
Symmetric size sphenoid sinuses.
IMPRESSION: 1. 15 mm sellar mass consistent with pituitary adenoma. No cavernous
sinus or suprasellar involvement.
2. Chronic small vessel ischemia with remote small vessel infarcts.

## 2020-11-10 MED ORDER — GADOBUTROL 1 MMOL/ML IV SOLN
10.0000 mL | Freq: Once | INTRAVENOUS | Status: AC | PRN
  Administered 2020-11-10: 9 mL via INTRAVENOUS

## 2020-11-12 ENCOUNTER — Ambulatory Visit: Payer: Medicare HMO | Admitting: Oncology

## 2020-11-14 ENCOUNTER — Telehealth: Payer: Self-pay

## 2020-11-14 NOTE — Telephone Encounter (Signed)
Per verbal from Dr. Diamantina Providence called pt to cancel his upcoming appointment as MRI shows benign cyst. No answer. Unable to leave detailed message as no DPR is on file.

## 2020-11-15 ENCOUNTER — Other Ambulatory Visit: Payer: Self-pay

## 2020-11-15 ENCOUNTER — Telehealth (INDEPENDENT_AMBULATORY_CARE_PROVIDER_SITE_OTHER): Payer: Medicare HMO | Admitting: Urology

## 2020-11-15 DIAGNOSIS — N281 Cyst of kidney, acquired: Secondary | ICD-10-CM | POA: Diagnosis not present

## 2020-11-15 NOTE — Progress Notes (Signed)
Virtual Visit via Telephone Note  I connected with Stephen Alvarado on 11/15/20 at  4:00 PM EST by telephone and verified that I am speaking with the correct person using two identifiers.   Patient location: Home Provider location: [facility]   I discussed the limitations, risks, security and privacy concerns of performing an evaluation and management service by telephone and the availability of in person appointments. We discussed the impact of the COVID-19 pandemic on the healthcare system, and the importance of social distancing and reducing patient and provider exposure. I also discussed with the patient that there may be a patient responsible charge related to this service. The patient expressed understanding and agreed to proceed.  Reason for visit: Renal cyst  History of Present Illness:  I had a virtual visit with Mr. Stephen Alvarado today regarding his renal cyst recently diagnosed by nephrology.  He is a 65 year old male with hypertension and diabetes who was referred to nephrology for creatinine of 1.5.  He underwent work-up with a renal ultrasound that showed medical renal disease, no hydronephrosis, and a complicated 1.9 cm left lower pole cystic lesion.  A follow-up MRI was performed on 11/08/2020 and this showed a corresponding benign 1.6 cm hemorrhagic cyst in the left lower pole with no worrisome renal lesions.  We reviewed behavioral strategies regarding improving his kidney function including smoking cessation, control of diabetes and hypertension, and adequate hydration, as well as avoiding nephrotoxic medications.  No further urology follow-up or imaging needed.    I discussed the assessment and treatment plan with the patient. The patient was provided an opportunity to ask questions and all were answered. The patient agreed with the plan and demonstrated an understanding of the instructions.   The patient was advised to call back or seek an in-person evaluation if the  symptoms worsen or if the condition fails to improve as anticipated.  I provided 12 minutes of non-face-to-face time during this encounter.   Billey Co, MD

## 2020-11-15 NOTE — Telephone Encounter (Signed)
Appointment changed to a virtual visit for this afternoon with Dr. Diamantina Providence.

## 2020-11-16 ENCOUNTER — Encounter: Payer: Self-pay | Admitting: Oncology

## 2020-11-16 ENCOUNTER — Inpatient Hospital Stay: Payer: Medicare HMO | Attending: Oncology | Admitting: Oncology

## 2020-11-16 VITALS — BP 149/69 | HR 76 | Temp 98.0°F | Resp 16 | Wt 198.7 lb

## 2020-11-16 DIAGNOSIS — F17210 Nicotine dependence, cigarettes, uncomplicated: Secondary | ICD-10-CM | POA: Insufficient documentation

## 2020-11-16 DIAGNOSIS — Z8249 Family history of ischemic heart disease and other diseases of the circulatory system: Secondary | ICD-10-CM | POA: Diagnosis not present

## 2020-11-16 DIAGNOSIS — N1832 Chronic kidney disease, stage 3b: Secondary | ICD-10-CM | POA: Insufficient documentation

## 2020-11-16 DIAGNOSIS — D352 Benign neoplasm of pituitary gland: Secondary | ICD-10-CM | POA: Diagnosis not present

## 2020-11-16 DIAGNOSIS — Z8379 Family history of other diseases of the digestive system: Secondary | ICD-10-CM | POA: Insufficient documentation

## 2020-11-16 DIAGNOSIS — E785 Hyperlipidemia, unspecified: Secondary | ICD-10-CM | POA: Insufficient documentation

## 2020-11-16 DIAGNOSIS — Z79899 Other long term (current) drug therapy: Secondary | ICD-10-CM | POA: Diagnosis not present

## 2020-11-16 DIAGNOSIS — E1142 Type 2 diabetes mellitus with diabetic polyneuropathy: Secondary | ICD-10-CM | POA: Diagnosis not present

## 2020-11-16 DIAGNOSIS — Z7982 Long term (current) use of aspirin: Secondary | ICD-10-CM | POA: Diagnosis not present

## 2020-11-16 DIAGNOSIS — N2889 Other specified disorders of kidney and ureter: Secondary | ICD-10-CM | POA: Diagnosis not present

## 2020-11-16 DIAGNOSIS — I10 Essential (primary) hypertension: Secondary | ICD-10-CM | POA: Insufficient documentation

## 2020-11-16 DIAGNOSIS — C880 Waldenstrom macroglobulinemia: Secondary | ICD-10-CM | POA: Insufficient documentation

## 2020-11-16 DIAGNOSIS — Z8673 Personal history of transient ischemic attack (TIA), and cerebral infarction without residual deficits: Secondary | ICD-10-CM | POA: Diagnosis not present

## 2020-11-16 DIAGNOSIS — Z833 Family history of diabetes mellitus: Secondary | ICD-10-CM | POA: Insufficient documentation

## 2020-11-16 DIAGNOSIS — Z7984 Long term (current) use of oral hypoglycemic drugs: Secondary | ICD-10-CM | POA: Insufficient documentation

## 2020-11-16 DIAGNOSIS — N289 Disorder of kidney and ureter, unspecified: Secondary | ICD-10-CM
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
## 2020-11-19 ENCOUNTER — Telehealth: Payer: Self-pay | Admitting: *Deleted

## 2020-11-19 NOTE — Telephone Encounter (Signed)
Pt states he has had 1 and 2nd vaccine and will schedule the booster with [facility]

## 2020-12-07 ENCOUNTER — Telehealth: Payer: Self-pay

## 2020-12-07 NOTE — Telephone Encounter (Signed)
Referral for patient to establish care with Dr. Gabriel Carina for pituitary adenoma was faxed on 1/19. Patient is scheduled for initial consult on 2/28.

## 2021-03-04 ENCOUNTER — Inpatient Hospital Stay: Payer: Medicare HMO | Attending: Oncology

## 2021-03-04 DIAGNOSIS — E1122 Type 2 diabetes mellitus with diabetic chronic kidney disease: Secondary | ICD-10-CM | POA: Insufficient documentation

## 2021-03-04 DIAGNOSIS — D352 Benign neoplasm of pituitary gland: Secondary | ICD-10-CM | POA: Insufficient documentation

## 2021-03-04 DIAGNOSIS — Z79899 Other long term (current) drug therapy: Secondary | ICD-10-CM | POA: Diagnosis not present

## 2021-03-04 DIAGNOSIS — E1142 Type 2 diabetes mellitus with diabetic polyneuropathy: Secondary | ICD-10-CM | POA: Insufficient documentation

## 2021-03-04 DIAGNOSIS — N289 Disorder of kidney and ureter, unspecified: Secondary | ICD-10-CM | POA: Diagnosis not present

## 2021-03-04 DIAGNOSIS — C880 Waldenstrom macroglobulinemia: Secondary | ICD-10-CM | POA: Insufficient documentation

## 2021-03-04 DIAGNOSIS — I129 Hypertensive chronic kidney disease with stage 1 through stage 4 chronic kidney disease, or unspecified chronic kidney disease: Secondary | ICD-10-CM | POA: Insufficient documentation

## 2021-03-04 DIAGNOSIS — E785 Hyperlipidemia, unspecified: Secondary | ICD-10-CM | POA: Diagnosis not present

## 2021-03-04 DIAGNOSIS — D472 Monoclonal gammopathy: Secondary | ICD-10-CM | POA: Diagnosis present

## 2021-03-04 DIAGNOSIS — N1832 Chronic kidney disease, stage 3b: Secondary | ICD-10-CM | POA: Insufficient documentation

## 2021-03-04 DIAGNOSIS — F17210 Nicotine dependence, cigarettes, uncomplicated: Secondary | ICD-10-CM | POA: Insufficient documentation

## 2021-03-04 DIAGNOSIS — Z7984 Long term (current) use of oral hypoglycemic drugs: Secondary | ICD-10-CM | POA: Diagnosis not present

## 2021-03-04 DIAGNOSIS — Z7982 Long term (current) use of aspirin: Secondary | ICD-10-CM | POA: Insufficient documentation

## 2021-03-04 LAB — CBC WITH DIFFERENTIAL/PLATELET
Abs Immature Granulocytes: 0.02 10*3/uL (ref 0.00–0.07)
Basophils Absolute: 0 10*3/uL (ref 0.0–0.1)
Basophils Relative: 1 %
Eosinophils Absolute: 0.4 10*3/uL (ref 0.0–0.5)
Eosinophils Relative: 7 %
HCT: 36.4 % — ABNORMAL LOW (ref 39.0–52.0)
Hemoglobin: 12.3 g/dL — ABNORMAL LOW (ref 13.0–17.0)
Immature Granulocytes: 0 %
Lymphocytes Relative: 29 %
Lymphs Abs: 1.9 10*3/uL (ref 0.7–4.0)
MCH: 29.9 pg (ref 26.0–34.0)
MCHC: 33.8 g/dL (ref 30.0–36.0)
MCV: 88.3 fL (ref 80.0–100.0)
Monocytes Absolute: 0.4 10*3/uL (ref 0.1–1.0)
Monocytes Relative: 7 %
Neutro Abs: 3.7 10*3/uL (ref 1.7–7.7)
Neutrophils Relative %: 56 %
Platelets: 197 10*3/uL (ref 150–400)
RBC: 4.12 MIL/uL — ABNORMAL LOW (ref 4.22–5.81)
RDW: 12.9 % (ref 11.5–15.5)
WBC: 6.5 10*3/uL (ref 4.0–10.5)
nRBC: 0 % (ref 0.0–0.2)

## 2021-03-04 LAB — COMPREHENSIVE METABOLIC PANEL
ALT: 23 U/L (ref 0–44)
AST: 20 U/L (ref 15–41)
Albumin: 3.7 g/dL (ref 3.5–5.0)
Alkaline Phosphatase: 63 U/L (ref 38–126)
Anion gap: 13 (ref 5–15)
BUN: 19 mg/dL (ref 8–23)
CO2: 24 mmol/L (ref 22–32)
Calcium: 9.4 mg/dL (ref 8.9–10.3)
Chloride: 100 mmol/L (ref 98–111)
Creatinine, Ser: 1.54 mg/dL — ABNORMAL HIGH (ref 0.61–1.24)
GFR, Estimated: 49 mL/min — ABNORMAL LOW (ref >60)
Glucose, Bld: 161 mg/dL — ABNORMAL HIGH (ref 70–99)
Potassium: 3.8 mmol/L (ref 3.5–5.1)
Sodium: 137 mmol/L (ref 135–145)
Total Bilirubin: 0.6 mg/dL (ref 0.3–1.2)
Total Protein: 8.1 g/dL (ref 6.5–8.1)

## 2021-03-05 LAB — VISCOSITY, SERUM: Viscosity, Serum: 2.1 rel.saline (ref 1.4–2.1)

## 2021-03-05 LAB — KAPPA/LAMBDA LIGHT CHAINS
Kappa free light chain: 442.2 mg/L — ABNORMAL HIGH (ref 3.3–19.4)
Kappa, lambda light chain ratio: 19.57 — ABNORMAL HIGH (ref 0.26–1.65)
Lambda free light chains: 22.6 mg/L (ref 5.7–26.3)

## 2021-03-07 LAB — MULTIPLE MYELOMA PANEL, SERUM
Albumin SerPl Elph-Mcnc: 3.9 g/dL (ref 2.9–4.4)
Albumin/Glob SerPl: 1.1 (ref 0.7–1.7)
Alpha 1: 0.2 g/dL (ref 0.0–0.4)
Alpha2 Glob SerPl Elph-Mcnc: 0.6 g/dL (ref 0.4–1.0)
B-Globulin SerPl Elph-Mcnc: 0.7 g/dL (ref 0.7–1.3)
Gamma Glob SerPl Elph-Mcnc: 2.3 g/dL — ABNORMAL HIGH (ref 0.4–1.8)
Globulin, Total: 3.9 g/dL (ref 2.2–3.9)
IgA: 96 mg/dL (ref 61–437)
IgG (Immunoglobin G), Serum: 889 mg/dL (ref 603–1613)
IgM (Immunoglobulin M), Srm: 2284 mg/dL — ABNORMAL HIGH (ref 20–172)
M Protein SerPl Elph-Mcnc: 1.9 g/dL — ABNORMAL HIGH
Total Protein ELP: 7.8 g/dL (ref 6.0–8.5)

## 2021-03-11 ENCOUNTER — Encounter: Payer: Self-pay | Admitting: Oncology

## 2021-03-11 ENCOUNTER — Inpatient Hospital Stay: Payer: Medicare HMO | Admitting: Oncology

## 2021-03-11 VITALS — BP 138/80 | HR 77 | Temp 97.7°F | Resp 16 | Wt 188.8 lb

## 2021-03-11 DIAGNOSIS — C8800 Waldenstrom macroglobulinemia not having achieved remission: Secondary | ICD-10-CM

## 2021-03-11 DIAGNOSIS — N289 Disorder of kidney and ureter, unspecified: Secondary | ICD-10-CM | POA: Diagnosis not present

## 2021-03-11 DIAGNOSIS — D352 Benign neoplasm of pituitary gland: Secondary | ICD-10-CM
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
## 2021-03-11 NOTE — Progress Notes (Signed)
Patient is scheduled for an appointment with orthopaedic next week to discuss right hip surgery.

## 2021-09-09 ENCOUNTER — Other Ambulatory Visit: Payer: Medicare HMO

## 2021-09-11 ENCOUNTER — Inpatient Hospital Stay: Payer: Medicare HMO | Attending: Oncology

## 2021-09-11 ENCOUNTER — Other Ambulatory Visit: Payer: Self-pay

## 2021-09-11 DIAGNOSIS — C8800 Waldenstrom macroglobulinemia not having achieved remission: Secondary | ICD-10-CM

## 2021-09-11 DIAGNOSIS — E1122 Type 2 diabetes mellitus with diabetic chronic kidney disease: Secondary | ICD-10-CM | POA: Diagnosis not present

## 2021-09-11 DIAGNOSIS — I129 Hypertensive chronic kidney disease with stage 1 through stage 4 chronic kidney disease, or unspecified chronic kidney disease: Secondary | ICD-10-CM | POA: Diagnosis not present

## 2021-09-11 DIAGNOSIS — F17210 Nicotine dependence, cigarettes, uncomplicated: Secondary | ICD-10-CM | POA: Insufficient documentation

## 2021-09-11 DIAGNOSIS — N1832 Chronic kidney disease, stage 3b: Secondary | ICD-10-CM | POA: Diagnosis not present

## 2021-09-11 DIAGNOSIS — E1142 Type 2 diabetes mellitus with diabetic polyneuropathy: Secondary | ICD-10-CM | POA: Insufficient documentation

## 2021-09-11 LAB — CBC WITH DIFFERENTIAL/PLATELET
Abs Immature Granulocytes: 0.02 10*3/uL (ref 0.00–0.07)
Basophils Absolute: 0 10*3/uL (ref 0.0–0.1)
Basophils Relative: 0 %
Eosinophils Absolute: 0.4 10*3/uL (ref 0.0–0.5)
Eosinophils Relative: 6 %
HCT: 35.8 % — ABNORMAL LOW (ref 39.0–52.0)
Hemoglobin: 12.1 g/dL — ABNORMAL LOW (ref 13.0–17.0)
Immature Granulocytes: 0 %
Lymphocytes Relative: 28 %
Lymphs Abs: 2 10*3/uL (ref 0.7–4.0)
MCH: 28.9 pg (ref 26.0–34.0)
MCHC: 33.8 g/dL (ref 30.0–36.0)
MCV: 85.4 fL (ref 80.0–100.0)
Monocytes Absolute: 0.5 10*3/uL (ref 0.1–1.0)
Monocytes Relative: 8 %
Neutro Abs: 4.1 10*3/uL (ref 1.7–7.7)
Neutrophils Relative %: 58 %
Platelets: 197 10*3/uL (ref 150–400)
RBC: 4.19 MIL/uL — ABNORMAL LOW (ref 4.22–5.81)
RDW: 13.8 % (ref 11.5–15.5)
WBC: 7.1 10*3/uL (ref 4.0–10.5)
nRBC: 0 % (ref 0.0–0.2)

## 2021-09-11 LAB — COMPREHENSIVE METABOLIC PANEL
ALT: 14 U/L (ref 0–44)
AST: 18 U/L (ref 15–41)
Albumin: 3.7 g/dL (ref 3.5–5.0)
Alkaline Phosphatase: 74 U/L (ref 38–126)
Anion gap: 8 (ref 5–15)
BUN: 20 mg/dL (ref 8–23)
CO2: 25 mmol/L (ref 22–32)
Calcium: 9 mg/dL (ref 8.9–10.3)
Chloride: 102 mmol/L (ref 98–111)
Creatinine, Ser: 1.62 mg/dL — ABNORMAL HIGH (ref 0.61–1.24)
GFR, Estimated: 46 mL/min — ABNORMAL LOW (ref >60)
Glucose, Bld: 136 mg/dL — ABNORMAL HIGH (ref 70–99)
Potassium: 3.7 mmol/L (ref 3.5–5.1)
Sodium: 135 mmol/L (ref 135–145)
Total Bilirubin: 0.4 mg/dL (ref 0.3–1.2)
Total Protein: 7.9 g/dL (ref 6.5–8.1)

## 2021-09-12 LAB — KAPPA/LAMBDA LIGHT CHAINS
Kappa free light chain: 391.3 mg/L — ABNORMAL HIGH (ref 3.3–19.4)
Kappa, lambda light chain ratio: 15.35 — ABNORMAL HIGH (ref 0.26–1.65)
Lambda free light chains: 25.5 mg/L (ref 5.7–26.3)

## 2021-09-16 ENCOUNTER — Ambulatory Visit: Payer: Medicare HMO | Admitting: Oncology

## 2021-09-16 LAB — MULTIPLE MYELOMA PANEL, SERUM
Albumin SerPl Elph-Mcnc: 3.9 g/dL (ref 2.9–4.4)
Albumin/Glob SerPl: 1 (ref 0.7–1.7)
Alpha 1: 0.2 g/dL (ref 0.0–0.4)
Alpha2 Glob SerPl Elph-Mcnc: 0.6 g/dL (ref 0.4–1.0)
B-Globulin SerPl Elph-Mcnc: 0.8 g/dL (ref 0.7–1.3)
Gamma Glob SerPl Elph-Mcnc: 2.5 g/dL — ABNORMAL HIGH (ref 0.4–1.8)
Globulin, Total: 4.1 g/dL — ABNORMAL HIGH (ref 2.2–3.9)
IgA: 85 mg/dL (ref 61–437)
IgG (Immunoglobin G), Serum: 1032 mg/dL (ref 603–1613)
IgM (Immunoglobulin M), Srm: 2315 mg/dL — ABNORMAL HIGH (ref 20–172)
M Protein SerPl Elph-Mcnc: 2.2 g/dL — ABNORMAL HIGH
Total Protein ELP: 8 g/dL (ref 6.0–8.5)

## 2021-09-17 ENCOUNTER — Other Ambulatory Visit: Payer: Self-pay

## 2021-09-17 DIAGNOSIS — C8800 Waldenstrom macroglobulinemia not having achieved remission: Secondary | ICD-10-CM

## 2021-09-17 LAB — VISCOSITY, SERUM: Viscosity, Serum: 2 rel.saline (ref 1.4–2.1)

## 2021-09-18 ENCOUNTER — Encounter: Payer: Self-pay | Admitting: Oncology

## 2021-09-18 ENCOUNTER — Inpatient Hospital Stay (HOSPITAL_BASED_OUTPATIENT_CLINIC_OR_DEPARTMENT_OTHER): Payer: Medicare HMO | Admitting: Oncology

## 2021-09-18 ENCOUNTER — Other Ambulatory Visit: Payer: Self-pay

## 2021-09-18 VITALS — BP 120/83 | HR 71 | Temp 97.7°F | Resp 18 | Wt 198.8 lb

## 2021-09-18 DIAGNOSIS — C880 Waldenstrom macroglobulinemia: Secondary | ICD-10-CM | POA: Diagnosis not present

## 2021-09-18 DIAGNOSIS — N1831 Chronic kidney disease, stage 3a: Secondary | ICD-10-CM
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
## 2022-03-18 ENCOUNTER — Inpatient Hospital Stay: Payer: Medicare HMO | Attending: Oncology

## 2022-03-18 DIAGNOSIS — C8800 Waldenstrom macroglobulinemia not having achieved remission: Secondary | ICD-10-CM

## 2022-03-18 DIAGNOSIS — N1832 Chronic kidney disease, stage 3b: Secondary | ICD-10-CM | POA: Insufficient documentation

## 2022-03-18 DIAGNOSIS — F17210 Nicotine dependence, cigarettes, uncomplicated: Secondary | ICD-10-CM | POA: Insufficient documentation

## 2022-03-18 DIAGNOSIS — E1122 Type 2 diabetes mellitus with diabetic chronic kidney disease: Secondary | ICD-10-CM | POA: Diagnosis not present

## 2022-03-18 DIAGNOSIS — Z809 Family history of malignant neoplasm, unspecified: Secondary | ICD-10-CM | POA: Diagnosis not present

## 2022-03-18 LAB — CBC WITH DIFFERENTIAL/PLATELET
Abs Immature Granulocytes: 0.03 10*3/uL (ref 0.00–0.07)
Basophils Absolute: 0 10*3/uL (ref 0.0–0.1)
Basophils Relative: 1 %
Eosinophils Absolute: 0.5 10*3/uL (ref 0.0–0.5)
Eosinophils Relative: 7 %
HCT: 36.8 % — ABNORMAL LOW (ref 39.0–52.0)
Hemoglobin: 12.6 g/dL — ABNORMAL LOW (ref 13.0–17.0)
Immature Granulocytes: 0 %
Lymphocytes Relative: 28 %
Lymphs Abs: 1.9 10*3/uL (ref 0.7–4.0)
MCH: 29.4 pg (ref 26.0–34.0)
MCHC: 34.2 g/dL (ref 30.0–36.0)
MCV: 86 fL (ref 80.0–100.0)
Monocytes Absolute: 0.5 10*3/uL (ref 0.1–1.0)
Monocytes Relative: 8 %
Neutro Abs: 3.9 10*3/uL (ref 1.7–7.7)
Neutrophils Relative %: 56 %
Platelets: 196 10*3/uL (ref 150–400)
RBC: 4.28 MIL/uL (ref 4.22–5.81)
RDW: 13.2 % (ref 11.5–15.5)
WBC: 6.8 10*3/uL (ref 4.0–10.5)
nRBC: 0 % (ref 0.0–0.2)

## 2022-03-18 LAB — HEPATITIS PANEL, ACUTE
HCV Ab: NONREACTIVE
Hep A IgM: NONREACTIVE
Hep B C IgM: NONREACTIVE
Hepatitis B Surface Ag: NONREACTIVE

## 2022-03-18 LAB — COMPREHENSIVE METABOLIC PANEL
ALT: 13 U/L (ref 0–44)
AST: 19 U/L (ref 15–41)
Albumin: 3.6 g/dL (ref 3.5–5.0)
Alkaline Phosphatase: 70 U/L (ref 38–126)
Anion gap: 9 (ref 5–15)
BUN: 20 mg/dL (ref 8–23)
CO2: 25 mmol/L (ref 22–32)
Calcium: 9.1 mg/dL (ref 8.9–10.3)
Chloride: 99 mmol/L (ref 98–111)
Creatinine, Ser: 1.76 mg/dL — ABNORMAL HIGH (ref 0.61–1.24)
GFR, Estimated: 42 mL/min — ABNORMAL LOW (ref >60)
Glucose, Bld: 219 mg/dL — ABNORMAL HIGH (ref 70–99)
Potassium: 3.7 mmol/L (ref 3.5–5.1)
Sodium: 133 mmol/L — ABNORMAL LOW (ref 135–145)
Total Bilirubin: 0.4 mg/dL (ref 0.3–1.2)
Total Protein: 8.2 g/dL — ABNORMAL HIGH (ref 6.5–8.1)

## 2022-03-18 LAB — HIV ANTIBODY (ROUTINE TESTING W REFLEX): HIV Screen 4th Generation wRfx: NONREACTIVE

## 2022-03-19 LAB — KAPPA/LAMBDA LIGHT CHAINS
Kappa free light chain: 360.1 mg/L — ABNORMAL HIGH (ref 3.3–19.4)
Kappa, lambda light chain ratio: 14.35 — ABNORMAL HIGH (ref 0.26–1.65)
Lambda free light chains: 25.1 mg/L (ref 5.7–26.3)

## 2022-03-24 LAB — MULTIPLE MYELOMA PANEL, SERUM
Albumin SerPl Elph-Mcnc: 3.5 g/dL (ref 2.9–4.4)
Albumin/Glob SerPl: 0.9 (ref 0.7–1.7)
Alpha 1: 0.2 g/dL (ref 0.0–0.4)
Alpha2 Glob SerPl Elph-Mcnc: 0.7 g/dL (ref 0.4–1.0)
B-Globulin SerPl Elph-Mcnc: 0.9 g/dL (ref 0.7–1.3)
Gamma Glob SerPl Elph-Mcnc: 2.5 g/dL — ABNORMAL HIGH (ref 0.4–1.8)
Globulin, Total: 4.3 g/dL — ABNORMAL HIGH (ref 2.2–3.9)
IgA: 82 mg/dL (ref 61–437)
IgG (Immunoglobin G), Serum: 1038 mg/dL (ref 603–1613)
IgM (Immunoglobulin M), Srm: 2347 mg/dL — ABNORMAL HIGH (ref 20–172)
M Protein SerPl Elph-Mcnc: 2.1 g/dL — ABNORMAL HIGH
Total Protein ELP: 7.8 g/dL (ref 6.0–8.5)

## 2022-03-25 ENCOUNTER — Inpatient Hospital Stay: Payer: Medicare HMO | Admitting: Oncology

## 2022-03-25 ENCOUNTER — Encounter: Payer: Self-pay | Admitting: Oncology

## 2022-03-25 VITALS — BP 138/77 | HR 67 | Temp 98.1°F | Ht 71.0 in | Wt 202.0 lb

## 2022-03-25 DIAGNOSIS — C8800 Waldenstrom macroglobulinemia not having achieved remission: Secondary | ICD-10-CM

## 2022-03-25 DIAGNOSIS — N1831 Chronic kidney disease, stage 3a: Secondary | ICD-10-CM

## 2022-03-25 LAB — VISCOSITY, SERUM: Viscosity, Serum: 2 rel.saline (ref 1.4–2.1)
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
## 2022-08-25 ENCOUNTER — Inpatient Hospital Stay: Payer: Medicare HMO | Attending: Oncology

## 2022-08-25 DIAGNOSIS — E1122 Type 2 diabetes mellitus with diabetic chronic kidney disease: Secondary | ICD-10-CM | POA: Diagnosis not present

## 2022-08-25 DIAGNOSIS — D472 Monoclonal gammopathy: Secondary | ICD-10-CM | POA: Diagnosis not present

## 2022-08-25 DIAGNOSIS — I129 Hypertensive chronic kidney disease with stage 1 through stage 4 chronic kidney disease, or unspecified chronic kidney disease: Secondary | ICD-10-CM | POA: Diagnosis not present

## 2022-08-25 DIAGNOSIS — C880 Waldenstrom macroglobulinemia: Secondary | ICD-10-CM | POA: Diagnosis present

## 2022-08-25 DIAGNOSIS — R7989 Other specified abnormal findings of blood chemistry: Secondary | ICD-10-CM | POA: Insufficient documentation

## 2022-08-25 DIAGNOSIS — F17210 Nicotine dependence, cigarettes, uncomplicated: Secondary | ICD-10-CM | POA: Insufficient documentation

## 2022-08-25 DIAGNOSIS — N1831 Chronic kidney disease, stage 3a: Secondary | ICD-10-CM | POA: Insufficient documentation

## 2022-08-25 LAB — CBC WITH DIFFERENTIAL/PLATELET
Abs Immature Granulocytes: 0.02 10*3/uL (ref 0.00–0.07)
Basophils Absolute: 0 10*3/uL (ref 0.0–0.1)
Basophils Relative: 1 %
Eosinophils Absolute: 0.5 10*3/uL (ref 0.0–0.5)
Eosinophils Relative: 6 %
HCT: 35.8 % — ABNORMAL LOW (ref 39.0–52.0)
Hemoglobin: 12 g/dL — ABNORMAL LOW (ref 13.0–17.0)
Immature Granulocytes: 0 %
Lymphocytes Relative: 26 %
Lymphs Abs: 2 10*3/uL (ref 0.7–4.0)
MCH: 29.1 pg (ref 26.0–34.0)
MCHC: 33.5 g/dL (ref 30.0–36.0)
MCV: 86.9 fL (ref 80.0–100.0)
Monocytes Absolute: 0.5 10*3/uL (ref 0.1–1.0)
Monocytes Relative: 7 %
Neutro Abs: 4.6 10*3/uL (ref 1.7–7.7)
Neutrophils Relative %: 60 %
Platelets: 204 10*3/uL (ref 150–400)
RBC: 4.12 MIL/uL — ABNORMAL LOW (ref 4.22–5.81)
RDW: 13.3 % (ref 11.5–15.5)
WBC: 7.6 10*3/uL (ref 4.0–10.5)
nRBC: 0 % (ref 0.0–0.2)

## 2022-08-25 LAB — COMPREHENSIVE METABOLIC PANEL
ALT: 14 U/L (ref 0–44)
AST: 20 U/L (ref 15–41)
Albumin: 3.5 g/dL (ref 3.5–5.0)
Alkaline Phosphatase: 61 U/L (ref 38–126)
Anion gap: 8 (ref 5–15)
BUN: 26 mg/dL — ABNORMAL HIGH (ref 8–23)
CO2: 24 mmol/L (ref 22–32)
Calcium: 9 mg/dL (ref 8.9–10.3)
Chloride: 101 mmol/L (ref 98–111)
Creatinine, Ser: 1.79 mg/dL — ABNORMAL HIGH (ref 0.61–1.24)
GFR, Estimated: 41 mL/min — ABNORMAL LOW (ref >60)
Glucose, Bld: 199 mg/dL — ABNORMAL HIGH (ref 70–99)
Potassium: 3.8 mmol/L (ref 3.5–5.1)
Sodium: 133 mmol/L — ABNORMAL LOW (ref 135–145)
Total Bilirubin: 0.4 mg/dL (ref 0.3–1.2)
Total Protein: 8.1 g/dL (ref 6.5–8.1)

## 2022-08-25 LAB — LACTATE DEHYDROGENASE: LDH: 89 U/L — ABNORMAL LOW (ref 98–192)

## 2022-08-26 LAB — KAPPA/LAMBDA LIGHT CHAINS
Kappa free light chain: 395 mg/L — ABNORMAL HIGH (ref 3.3–19.4)
Kappa, lambda light chain ratio: 14.26 — ABNORMAL HIGH (ref 0.26–1.65)
Lambda free light chains: 27.7 mg/L — ABNORMAL HIGH (ref 5.7–26.3)

## 2022-08-27 LAB — VISCOSITY, SERUM: Viscosity, Serum: 2 rel.saline (ref 1.4–2.1)

## 2022-08-29 LAB — MULTIPLE MYELOMA PANEL, SERUM
Albumin SerPl Elph-Mcnc: 3.7 g/dL (ref 2.9–4.4)
Albumin/Glob SerPl: 1 (ref 0.7–1.7)
Alpha 1: 0.2 g/dL (ref 0.0–0.4)
Alpha2 Glob SerPl Elph-Mcnc: 0.6 g/dL (ref 0.4–1.0)
B-Globulin SerPl Elph-Mcnc: 0.8 g/dL (ref 0.7–1.3)
Gamma Glob SerPl Elph-Mcnc: 2.4 g/dL — ABNORMAL HIGH (ref 0.4–1.8)
Globulin, Total: 4 g/dL — ABNORMAL HIGH (ref 2.2–3.9)
IgA: 78 mg/dL (ref 61–437)
IgG (Immunoglobin G), Serum: 1036 mg/dL (ref 603–1613)
IgM (Immunoglobulin M), Srm: 2221 mg/dL — ABNORMAL HIGH (ref 20–172)
M Protein SerPl Elph-Mcnc: 1.9 g/dL — ABNORMAL HIGH
Total Protein ELP: 7.7 g/dL (ref 6.0–8.5)

## 2022-09-01 ENCOUNTER — Encounter: Payer: Self-pay | Admitting: Oncology

## 2022-09-01 ENCOUNTER — Inpatient Hospital Stay: Payer: Medicare HMO | Admitting: Oncology

## 2022-09-01 VITALS — BP 135/81 | HR 67 | Temp 96.9°F | Resp 18 | Wt 197.3 lb

## 2022-09-01 DIAGNOSIS — N1831 Chronic kidney disease, stage 3a: Secondary | ICD-10-CM | POA: Diagnosis not present

## 2022-09-01 DIAGNOSIS — C880 Waldenstrom macroglobulinemia: Secondary | ICD-10-CM | POA: Diagnosis not present

## 2022-09-01 DIAGNOSIS — N1832 Chronic kidney disease, stage 3b: Secondary | ICD-10-CM | POA: Insufficient documentation

## 2022-09-01 NOTE — Assessment & Plan Note (Signed)
Avoid nephrotoxins.  Encourage oral hydration  

## 2022-09-01 NOTE — Assessment & Plan Note (Signed)
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  IgM 1954 -->2284--> 2315-->2347 M protein 1.7-->1.9--> 2.2-->2.1-->1.9 Viscosity is normal  Stable disease.Patient is asymptomatic.  No signs of hyperviscosity syndrome symptoms  Recommend continue observation.
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
## 2023-03-03 ENCOUNTER — Inpatient Hospital Stay: Payer: Medicare HMO | Attending: Oncology

## 2023-03-06 ENCOUNTER — Inpatient Hospital Stay: Payer: Medicare HMO | Attending: Oncology

## 2023-03-06 DIAGNOSIS — I129 Hypertensive chronic kidney disease with stage 1 through stage 4 chronic kidney disease, or unspecified chronic kidney disease: Secondary | ICD-10-CM | POA: Diagnosis not present

## 2023-03-06 DIAGNOSIS — C880 Waldenstrom macroglobulinemia: Secondary | ICD-10-CM | POA: Diagnosis present

## 2023-03-06 DIAGNOSIS — N1832 Chronic kidney disease, stage 3b: Secondary | ICD-10-CM | POA: Insufficient documentation

## 2023-03-06 DIAGNOSIS — E1122 Type 2 diabetes mellitus with diabetic chronic kidney disease: Secondary | ICD-10-CM | POA: Insufficient documentation

## 2023-03-06 DIAGNOSIS — F17210 Nicotine dependence, cigarettes, uncomplicated: Secondary | ICD-10-CM | POA: Diagnosis not present

## 2023-03-06 DIAGNOSIS — N1831 Chronic kidney disease, stage 3a: Secondary | ICD-10-CM

## 2023-03-06 DIAGNOSIS — D472 Monoclonal gammopathy: Secondary | ICD-10-CM | POA: Insufficient documentation

## 2023-03-06 LAB — CBC WITH DIFFERENTIAL/PLATELET
Abs Immature Granulocytes: 0.04 10*3/uL (ref 0.00–0.07)
Basophils Absolute: 0 10*3/uL (ref 0.0–0.1)
Basophils Relative: 0 %
Eosinophils Absolute: 0.5 10*3/uL (ref 0.0–0.5)
Eosinophils Relative: 5 %
HCT: 37.9 % — ABNORMAL LOW (ref 39.0–52.0)
Hemoglobin: 12.5 g/dL — ABNORMAL LOW (ref 13.0–17.0)
Immature Granulocytes: 0 %
Lymphocytes Relative: 23 %
Lymphs Abs: 2.1 10*3/uL (ref 0.7–4.0)
MCH: 28.7 pg (ref 26.0–34.0)
MCHC: 33 g/dL (ref 30.0–36.0)
MCV: 86.9 fL (ref 80.0–100.0)
Monocytes Absolute: 0.7 10*3/uL (ref 0.1–1.0)
Monocytes Relative: 8 %
Neutro Abs: 5.7 10*3/uL (ref 1.7–7.7)
Neutrophils Relative %: 64 %
Platelets: 215 10*3/uL (ref 150–400)
RBC: 4.36 MIL/uL (ref 4.22–5.81)
RDW: 13.2 % (ref 11.5–15.5)
WBC: 9 10*3/uL (ref 4.0–10.5)
nRBC: 0 % (ref 0.0–0.2)

## 2023-03-06 LAB — COMPREHENSIVE METABOLIC PANEL
ALT: 15 U/L (ref 0–44)
AST: 18 U/L (ref 15–41)
Albumin: 3.7 g/dL (ref 3.5–5.0)
Alkaline Phosphatase: 79 U/L (ref 38–126)
Anion gap: 10 (ref 5–15)
BUN: 24 mg/dL — ABNORMAL HIGH (ref 8–23)
CO2: 24 mmol/L (ref 22–32)
Calcium: 9.1 mg/dL (ref 8.9–10.3)
Chloride: 100 mmol/L (ref 98–111)
Creatinine, Ser: 1.94 mg/dL — ABNORMAL HIGH (ref 0.61–1.24)
GFR, Estimated: 37 mL/min — ABNORMAL LOW (ref >60)
Glucose, Bld: 238 mg/dL — ABNORMAL HIGH (ref 70–99)
Potassium: 3.8 mmol/L (ref 3.5–5.1)
Sodium: 134 mmol/L — ABNORMAL LOW (ref 135–145)
Total Bilirubin: 0.6 mg/dL (ref 0.3–1.2)
Total Protein: 8.4 g/dL — ABNORMAL HIGH (ref 6.5–8.1)

## 2023-03-06 LAB — LACTATE DEHYDROGENASE: LDH: 97 U/L — ABNORMAL LOW (ref 98–192)

## 2023-03-07 LAB — BETA 2 MICROGLOBULIN, SERUM: Beta-2 Microglobulin: 2.9 mg/L — ABNORMAL HIGH (ref 0.6–2.4)

## 2023-03-09 LAB — KAPPA/LAMBDA LIGHT CHAINS
Kappa free light chain: 301.8 mg/L — ABNORMAL HIGH (ref 3.3–19.4)
Kappa, lambda light chain ratio: 10.52 — ABNORMAL HIGH (ref 0.26–1.65)
Lambda free light chains: 28.7 mg/L — ABNORMAL HIGH (ref 5.7–26.3)

## 2023-03-10 ENCOUNTER — Inpatient Hospital Stay: Payer: Medicare HMO | Admitting: Oncology

## 2023-03-10 ENCOUNTER — Encounter: Payer: Self-pay | Admitting: Oncology

## 2023-03-10 VITALS — BP 135/73 | HR 75 | Temp 98.9°F | Resp 18 | Wt 198.2 lb

## 2023-03-10 DIAGNOSIS — C880 Waldenstrom macroglobulinemia: Secondary | ICD-10-CM

## 2023-03-10 DIAGNOSIS — N1831 Chronic kidney disease, stage 3a: Secondary | ICD-10-CM | POA: Diagnosis not present

## 2023-03-10 LAB — VISCOSITY, SERUM: Viscosity, Serum: 2.2 rel.saline — ABNORMAL HIGH (ref 1.4–2.1)

## 2023-03-10 NOTE — Assessment & Plan Note (Addendum)
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  IgM 1954 -->2284--> 2315-->2347-->2760 M protein 1.7-->1.9--> 2.2-->2.1-->1.9-->1.9 Viscosity 2.2 Stable disease.Patient is asymptomatic.  No signs of hyperviscosity syndrome symptoms  Recommend continue observation.
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
## 2023-03-10 NOTE — Assessment & Plan Note (Addendum)
Creatine is worse.  Avoid nephrotoxins.  Encourage oral hydration.

## 2023-03-12 LAB — MULTIPLE MYELOMA PANEL, SERUM
Albumin SerPl Elph-Mcnc: 3.5 g/dL (ref 2.9–4.4)
Albumin/Glob SerPl: 0.8 (ref 0.7–1.7)
Alpha 1: 0.3 g/dL (ref 0.0–0.4)
Alpha2 Glob SerPl Elph-Mcnc: 0.7 g/dL (ref 0.4–1.0)
B-Globulin SerPl Elph-Mcnc: 0.9 g/dL (ref 0.7–1.3)
Gamma Glob SerPl Elph-Mcnc: 2.6 g/dL — ABNORMAL HIGH (ref 0.4–1.8)
Globulin, Total: 4.5 g/dL — ABNORMAL HIGH (ref 2.2–3.9)
IgA: 93 mg/dL (ref 61–437)
IgG (Immunoglobin G), Serum: 1056 mg/dL (ref 603–1613)
IgM (Immunoglobulin M), Srm: 2760 mg/dL — ABNORMAL HIGH (ref 20–172)
M Protein SerPl Elph-Mcnc: 1.9 g/dL — ABNORMAL HIGH
Total Protein ELP: 8 g/dL (ref 6.0–8.5)

## 2023-08-31 ENCOUNTER — Inpatient Hospital Stay: Payer: Medicare HMO | Attending: Oncology

## 2023-08-31 DIAGNOSIS — N1831 Chronic kidney disease, stage 3a: Secondary | ICD-10-CM | POA: Insufficient documentation

## 2023-08-31 DIAGNOSIS — C8800 Waldenstrom macroglobulinemia not having achieved remission: Secondary | ICD-10-CM | POA: Insufficient documentation

## 2023-08-31 LAB — CBC WITH DIFFERENTIAL (CANCER CENTER ONLY)
Abs Immature Granulocytes: 0.03 10*3/uL (ref 0.00–0.07)
Basophils Absolute: 0 10*3/uL (ref 0.0–0.1)
Basophils Relative: 1 %
Eosinophils Absolute: 0.5 10*3/uL (ref 0.0–0.5)
Eosinophils Relative: 8 %
HCT: 41.1 % (ref 39.0–52.0)
Hemoglobin: 13.4 g/dL (ref 13.0–17.0)
Immature Granulocytes: 0 %
Lymphocytes Relative: 22 %
Lymphs Abs: 1.5 10*3/uL (ref 0.7–4.0)
MCH: 28.3 pg (ref 26.0–34.0)
MCHC: 32.6 g/dL (ref 30.0–36.0)
MCV: 86.9 fL (ref 80.0–100.0)
Monocytes Absolute: 0.5 10*3/uL (ref 0.1–1.0)
Monocytes Relative: 7 %
Neutro Abs: 4.3 10*3/uL (ref 1.7–7.7)
Neutrophils Relative %: 62 %
Platelet Count: 188 10*3/uL (ref 150–400)
RBC: 4.73 MIL/uL (ref 4.22–5.81)
RDW: 13.4 % (ref 11.5–15.5)
WBC Count: 6.8 10*3/uL (ref 4.0–10.5)
nRBC: 0 % (ref 0.0–0.2)

## 2023-08-31 LAB — CMP (CANCER CENTER ONLY)
ALT: 15 U/L (ref 0–44)
AST: 17 U/L (ref 15–41)
Albumin: 3.7 g/dL (ref 3.5–5.0)
Alkaline Phosphatase: 70 U/L (ref 38–126)
Anion gap: 10 (ref 5–15)
BUN: 21 mg/dL (ref 8–23)
CO2: 25 mmol/L (ref 22–32)
Calcium: 9.3 mg/dL (ref 8.9–10.3)
Chloride: 99 mmol/L (ref 98–111)
Creatinine: 1.64 mg/dL — ABNORMAL HIGH (ref 0.61–1.24)
GFR, Estimated: 45 mL/min — ABNORMAL LOW (ref >60)
Glucose, Bld: 291 mg/dL — ABNORMAL HIGH (ref 70–99)
Potassium: 3.8 mmol/L (ref 3.5–5.1)
Sodium: 134 mmol/L — ABNORMAL LOW (ref 135–145)
Total Bilirubin: 0.8 mg/dL (ref 0.3–1.2)
Total Protein: 8.5 g/dL — ABNORMAL HIGH (ref 6.5–8.1)

## 2023-08-31 LAB — LACTATE DEHYDROGENASE: LDH: 90 U/L — ABNORMAL LOW (ref 98–192)

## 2023-09-01 LAB — BETA 2 MICROGLOBULIN, SERUM: Beta-2 Microglobulin: 3.2 mg/L — ABNORMAL HIGH (ref 0.6–2.4)

## 2023-09-01 LAB — KAPPA/LAMBDA LIGHT CHAINS
Kappa free light chain: 332.1 mg/L — ABNORMAL HIGH (ref 3.3–19.4)
Kappa, lambda light chain ratio: 11.41 — ABNORMAL HIGH (ref 0.26–1.65)
Lambda free light chains: 29.1 mg/L — ABNORMAL HIGH (ref 5.7–26.3)

## 2023-09-02 LAB — VISCOSITY, SERUM: Viscosity, Serum: 2.2 rel.saline — ABNORMAL HIGH (ref 1.4–2.1)

## 2023-09-03 LAB — MULTIPLE MYELOMA PANEL, SERUM
Albumin SerPl Elph-Mcnc: 3.7 g/dL (ref 2.9–4.4)
Albumin/Glob SerPl: 0.9 (ref 0.7–1.7)
Alpha 1: 0.2 g/dL (ref 0.0–0.4)
Alpha2 Glob SerPl Elph-Mcnc: 0.7 g/dL (ref 0.4–1.0)
B-Globulin SerPl Elph-Mcnc: 0.9 g/dL (ref 0.7–1.3)
Gamma Glob SerPl Elph-Mcnc: 2.7 g/dL — ABNORMAL HIGH (ref 0.4–1.8)
Globulin, Total: 4.4 g/dL — ABNORMAL HIGH (ref 2.2–3.9)
IgA: 92 mg/dL (ref 61–437)
IgG (Immunoglobin G), Serum: 1056 mg/dL (ref 603–1613)
IgM (Immunoglobulin M), Srm: 2615 mg/dL — ABNORMAL HIGH (ref 20–172)
M Protein SerPl Elph-Mcnc: 2.1 g/dL — ABNORMAL HIGH
Total Protein ELP: 8.1 g/dL (ref 6.0–8.5)

## 2023-09-10 ENCOUNTER — Encounter: Payer: Self-pay | Admitting: Oncology

## 2023-09-10 ENCOUNTER — Inpatient Hospital Stay: Payer: Medicare HMO | Attending: Oncology | Admitting: Oncology

## 2023-09-10 VITALS — BP 131/73 | HR 61 | Temp 97.0°F | Resp 18 | Wt 189.0 lb

## 2023-09-10 DIAGNOSIS — R7989 Other specified abnormal findings of blood chemistry: Secondary | ICD-10-CM | POA: Insufficient documentation

## 2023-09-10 DIAGNOSIS — N1832 Chronic kidney disease, stage 3b: Secondary | ICD-10-CM | POA: Insufficient documentation

## 2023-09-10 DIAGNOSIS — I129 Hypertensive chronic kidney disease with stage 1 through stage 4 chronic kidney disease, or unspecified chronic kidney disease: Secondary | ICD-10-CM | POA: Diagnosis not present

## 2023-09-10 DIAGNOSIS — F17210 Nicotine dependence, cigarettes, uncomplicated: Secondary | ICD-10-CM | POA: Diagnosis not present

## 2023-09-10 DIAGNOSIS — N1831 Chronic kidney disease, stage 3a: Secondary | ICD-10-CM | POA: Diagnosis not present

## 2023-09-10 DIAGNOSIS — C8800 Waldenstrom macroglobulinemia not having achieved remission: Secondary | ICD-10-CM | POA: Diagnosis present

## 2023-09-10 DIAGNOSIS — E1122 Type 2 diabetes mellitus with diabetic chronic kidney disease: Secondary | ICD-10-CM | POA: Diagnosis not present
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
## 2023-09-10 NOTE — Assessment & Plan Note (Signed)
Avoid nephrotoxins.  Encourage oral hydration  

## 2023-09-10 NOTE — Assessment & Plan Note (Addendum)
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  IgM 1954 -->2284--> 2315-->2347-->2760-->2615 M protein 1.7-->1.9--> 2.2-->2.1-->1.9-->1.9-->2.1 Viscosity 2.2-->2.2 Stable disease.Patient is asymptomatic.  No signs of hyperviscosity syndrome symptoms  Recommend continue observation.

## 2024-03-09 ENCOUNTER — Inpatient Hospital Stay: Payer: Medicare HMO | Attending: Oncology

## 2024-03-09 DIAGNOSIS — C8800 Waldenstrom macroglobulinemia not having achieved remission: Secondary | ICD-10-CM | POA: Insufficient documentation

## 2024-03-09 DIAGNOSIS — F17210 Nicotine dependence, cigarettes, uncomplicated: Secondary | ICD-10-CM | POA: Diagnosis not present

## 2024-03-09 DIAGNOSIS — N1832 Chronic kidney disease, stage 3b: Secondary | ICD-10-CM | POA: Insufficient documentation

## 2024-03-09 LAB — CMP (CANCER CENTER ONLY)
ALT: 16 U/L (ref 0–44)
AST: 17 U/L (ref 15–41)
Albumin: 3.6 g/dL (ref 3.5–5.0)
Alkaline Phosphatase: 72 U/L (ref 38–126)
Anion gap: 11 (ref 5–15)
BUN: 22 mg/dL (ref 8–23)
CO2: 24 mmol/L (ref 22–32)
Calcium: 9.3 mg/dL (ref 8.9–10.3)
Chloride: 102 mmol/L (ref 98–111)
Creatinine: 1.79 mg/dL — ABNORMAL HIGH (ref 0.61–1.24)
GFR, Estimated: 41 mL/min — ABNORMAL LOW (ref >60)
Glucose, Bld: 161 mg/dL — ABNORMAL HIGH (ref 70–99)
Potassium: 3.9 mmol/L (ref 3.5–5.1)
Sodium: 137 mmol/L (ref 135–145)
Total Bilirubin: 0.3 mg/dL (ref 0.0–1.2)
Total Protein: 8.2 g/dL — ABNORMAL HIGH (ref 6.5–8.1)

## 2024-03-09 LAB — CBC WITH DIFFERENTIAL (CANCER CENTER ONLY)
Abs Immature Granulocytes: 0.01 10*3/uL (ref 0.00–0.07)
Basophils Absolute: 0 10*3/uL (ref 0.0–0.1)
Basophils Relative: 1 %
Eosinophils Absolute: 0.4 10*3/uL (ref 0.0–0.5)
Eosinophils Relative: 7 %
HCT: 36.9 % — ABNORMAL LOW (ref 39.0–52.0)
Hemoglobin: 12.3 g/dL — ABNORMAL LOW (ref 13.0–17.0)
Immature Granulocytes: 0 %
Lymphocytes Relative: 28 %
Lymphs Abs: 1.6 10*3/uL (ref 0.7–4.0)
MCH: 29 pg (ref 26.0–34.0)
MCHC: 33.3 g/dL (ref 30.0–36.0)
MCV: 87 fL (ref 80.0–100.0)
Monocytes Absolute: 0.5 10*3/uL (ref 0.1–1.0)
Monocytes Relative: 8 %
Neutro Abs: 3.3 10*3/uL (ref 1.7–7.7)
Neutrophils Relative %: 56 %
Platelet Count: 215 10*3/uL (ref 150–400)
RBC: 4.24 MIL/uL (ref 4.22–5.81)
RDW: 13.4 % (ref 11.5–15.5)
WBC Count: 5.8 10*3/uL (ref 4.0–10.5)
nRBC: 0 % (ref 0.0–0.2)

## 2024-03-09 LAB — LACTATE DEHYDROGENASE: LDH: 100 U/L (ref 98–192)

## 2024-03-10 LAB — BETA 2 MICROGLOBULIN, SERUM: Beta-2 Microglobulin: 3.2 mg/L — ABNORMAL HIGH (ref 0.6–2.4)

## 2024-03-10 LAB — VISCOSITY, SERUM: Viscosity, Serum: 2.2 rel.saline — ABNORMAL HIGH (ref 1.4–2.1)

## 2024-03-10 LAB — KAPPA/LAMBDA LIGHT CHAINS
Kappa free light chain: 431.9 mg/L — ABNORMAL HIGH (ref 3.3–19.4)
Kappa, lambda light chain ratio: 15.76 — ABNORMAL HIGH (ref 0.26–1.65)
Lambda free light chains: 27.4 mg/L — ABNORMAL HIGH (ref 5.7–26.3)

## 2024-03-11 ENCOUNTER — Other Ambulatory Visit: Payer: Self-pay

## 2024-03-11 DIAGNOSIS — N1831 Chronic kidney disease, stage 3a: Secondary | ICD-10-CM

## 2024-03-11 DIAGNOSIS — C8800 Waldenstrom macroglobulinemia not having achieved remission: Secondary | ICD-10-CM | POA: Diagnosis not present

## 2024-03-11 LAB — MULTIPLE MYELOMA PANEL, SERUM
Albumin SerPl Elph-Mcnc: 3.5 g/dL (ref 2.9–4.4)
Albumin/Glob SerPl: 0.9 (ref 0.7–1.7)
Alpha 1: 0.2 g/dL (ref 0.0–0.4)
Alpha2 Glob SerPl Elph-Mcnc: 0.6 g/dL (ref 0.4–1.0)
B-Globulin SerPl Elph-Mcnc: 0.8 g/dL (ref 0.7–1.3)
Gamma Glob SerPl Elph-Mcnc: 2.7 g/dL — ABNORMAL HIGH (ref 0.4–1.8)
Globulin, Total: 4.3 g/dL — ABNORMAL HIGH (ref 2.2–3.9)
IgA: 80 mg/dL (ref 61–437)
IgG (Immunoglobin G), Serum: 1033 mg/dL (ref 603–1613)
IgM (Immunoglobulin M), Srm: 2666 mg/dL — ABNORMAL HIGH (ref 20–172)
M Protein SerPl Elph-Mcnc: 2.1 g/dL — ABNORMAL HIGH
Total Protein ELP: 7.8 g/dL (ref 6.0–8.5)

## 2024-03-14 ENCOUNTER — Other Ambulatory Visit: Payer: Medicare HMO

## 2024-03-18 LAB — IFE+PROTEIN ELECTRO, 24-HR UR
% BETA, Urine: 9.5 %
ALPHA 1 URINE: 2.5 %
Albumin, U: 38.2 %
Alpha 2, Urine: 6.9 %
GAMMA GLOBULIN URINE: 42.8 %
M-SPIKE %, Urine: 37.9 % — ABNORMAL HIGH
M-Spike, Mg/24 Hr: 729 mg/(24.h) — ABNORMAL HIGH
Total Protein, Urine-Ur/day: 1922 mg/(24.h) — ABNORMAL HIGH (ref 30–150)
Total Protein, Urine: 106.8 mg/dL
Total Volume: 1800

## 2024-03-23 ENCOUNTER — Encounter: Payer: Self-pay | Admitting: Oncology

## 2024-03-23 ENCOUNTER — Inpatient Hospital Stay: Payer: Medicare HMO | Admitting: Oncology

## 2024-03-23 VITALS — BP 136/80 | HR 64 | Temp 98.6°F | Resp 16 | Wt 202.0 lb

## 2024-03-23 DIAGNOSIS — C8800 Waldenstrom macroglobulinemia not having achieved remission: Secondary | ICD-10-CM | POA: Diagnosis not present

## 2024-03-23 DIAGNOSIS — N1831 Chronic kidney disease, stage 3a: Secondary | ICD-10-CM | POA: Diagnosis not present

## 2024-03-23 DIAGNOSIS — N1832 Chronic kidney disease, stage 3b: Secondary | ICD-10-CM

## 2024-03-23 NOTE — Assessment & Plan Note (Addendum)
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  IgM 1954 -->2284--> 2315-->2347-->2760-->2615-->2666 M protein 1.7-->1.9--> 2.2-->2.1-->1.9-->1.9-->2.1-->2.1 Viscosity 2.2-->2.2-->2,2 Stable disease.Patient is asymptomatic.  No signs of hyperviscosity syndrome symptoms  Recommend continue observation.

## 2024-03-23 NOTE — Progress Notes (Signed)
Patient has a couple of questions for the doctor today.
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
## 2024-03-23 NOTE — Assessment & Plan Note (Signed)
 Encourage oral hydration and avoid nephrotoxins.

## 2024-03-24 ENCOUNTER — Ambulatory Visit: Payer: Medicare HMO | Admitting: Oncology

## 2024-09-12 ENCOUNTER — Telehealth: Payer: Self-pay | Admitting: Oncology

## 2024-09-12 ENCOUNTER — Inpatient Hospital Stay

## 2024-09-12 NOTE — Telephone Encounter (Signed)
 Pt called to r/s lab appt from today. New date and time for this wed. Confirmed with pt

## 2024-09-14 ENCOUNTER — Inpatient Hospital Stay: Attending: Oncology

## 2024-09-14 DIAGNOSIS — N1832 Chronic kidney disease, stage 3b: Secondary | ICD-10-CM | POA: Diagnosis not present

## 2024-09-14 DIAGNOSIS — F17210 Nicotine dependence, cigarettes, uncomplicated: Secondary | ICD-10-CM | POA: Insufficient documentation

## 2024-09-14 DIAGNOSIS — C8800 Waldenstrom macroglobulinemia not having achieved remission: Secondary | ICD-10-CM | POA: Diagnosis present

## 2024-09-14 DIAGNOSIS — D472 Monoclonal gammopathy: Secondary | ICD-10-CM | POA: Diagnosis not present

## 2024-09-14 LAB — CMP (CANCER CENTER ONLY)
ALT: 14 U/L (ref 0–44)
AST: 21 U/L (ref 15–41)
Albumin: 3.6 g/dL (ref 3.5–5.0)
Alkaline Phosphatase: 65 U/L (ref 38–126)
Anion gap: 10 (ref 5–15)
BUN: 23 mg/dL (ref 8–23)
CO2: 22 mmol/L (ref 22–32)
Calcium: 9.4 mg/dL (ref 8.9–10.3)
Chloride: 101 mmol/L (ref 98–111)
Creatinine: 1.95 mg/dL — ABNORMAL HIGH (ref 0.61–1.24)
GFR, Estimated: 36 mL/min — ABNORMAL LOW (ref >60)
Glucose, Bld: 143 mg/dL — ABNORMAL HIGH (ref 70–99)
Potassium: 4 mmol/L (ref 3.5–5.1)
Sodium: 133 mmol/L — ABNORMAL LOW (ref 135–145)
Total Bilirubin: 0.4 mg/dL (ref 0.0–1.2)
Total Protein: 8.6 g/dL — ABNORMAL HIGH (ref 6.5–8.1)

## 2024-09-14 LAB — CBC WITH DIFFERENTIAL (CANCER CENTER ONLY)
Abs Immature Granulocytes: 0.03 K/uL (ref 0.00–0.07)
Basophils Absolute: 0 K/uL (ref 0.0–0.1)
Basophils Relative: 1 %
Eosinophils Absolute: 0.5 K/uL (ref 0.0–0.5)
Eosinophils Relative: 7 %
HCT: 37.3 % — ABNORMAL LOW (ref 39.0–52.0)
Hemoglobin: 12.5 g/dL — ABNORMAL LOW (ref 13.0–17.0)
Immature Granulocytes: 0 %
Lymphocytes Relative: 26 %
Lymphs Abs: 1.9 K/uL (ref 0.7–4.0)
MCH: 28.5 pg (ref 26.0–34.0)
MCHC: 33.5 g/dL (ref 30.0–36.0)
MCV: 85.2 fL (ref 80.0–100.0)
Monocytes Absolute: 0.5 K/uL (ref 0.1–1.0)
Monocytes Relative: 7 %
Neutro Abs: 4.4 K/uL (ref 1.7–7.7)
Neutrophils Relative %: 59 %
Platelet Count: 259 K/uL (ref 150–400)
RBC: 4.38 MIL/uL (ref 4.22–5.81)
RDW: 13.2 % (ref 11.5–15.5)
WBC Count: 7.3 K/uL (ref 4.0–10.5)
nRBC: 0 % (ref 0.0–0.2)

## 2024-09-14 LAB — LACTATE DEHYDROGENASE: LDH: 103 U/L — ABNORMAL LOW (ref 105–235)

## 2024-09-15 LAB — BETA 2 MICROGLOBULIN, SERUM: Beta-2 Microglobulin: 3 mg/L — ABNORMAL HIGH (ref 0.6–2.4)

## 2024-09-15 LAB — VISCOSITY, SERUM: Viscosity, Serum: 2.3 rel.saline — ABNORMAL HIGH (ref 1.4–2.1)

## 2024-09-15 LAB — KAPPA/LAMBDA LIGHT CHAINS
Kappa free light chain: 466.6 mg/L — ABNORMAL HIGH (ref 3.3–19.4)
Kappa, lambda light chain ratio: 15.45 — ABNORMAL HIGH (ref 0.26–1.65)
Lambda free light chains: 30.2 mg/L — ABNORMAL HIGH (ref 5.7–26.3)

## 2024-09-16 ENCOUNTER — Other Ambulatory Visit: Payer: Self-pay

## 2024-09-16 DIAGNOSIS — C8800 Waldenstrom macroglobulinemia not having achieved remission: Secondary | ICD-10-CM

## 2024-09-16 DIAGNOSIS — N1831 Chronic kidney disease, stage 3a: Secondary | ICD-10-CM

## 2024-09-17 LAB — MULTIPLE MYELOMA PANEL, SERUM
Albumin SerPl Elph-Mcnc: 3.3 g/dL (ref 2.9–4.4)
Albumin/Glob SerPl: 0.7 (ref 0.7–1.7)
Alpha 1: 0.2 g/dL (ref 0.0–0.4)
Alpha2 Glob SerPl Elph-Mcnc: 0.8 g/dL (ref 0.4–1.0)
B-Globulin SerPl Elph-Mcnc: 0.9 g/dL (ref 0.7–1.3)
Gamma Glob SerPl Elph-Mcnc: 2.8 g/dL — ABNORMAL HIGH (ref 0.4–1.8)
Globulin, Total: 4.8 g/dL — ABNORMAL HIGH (ref 2.2–3.9)
IgA: 94 mg/dL (ref 61–437)
IgG (Immunoglobin G), Serum: 1155 mg/dL (ref 603–1613)
IgM (Immunoglobulin M), Srm: 2640 mg/dL — ABNORMAL HIGH (ref 20–172)
M Protein SerPl Elph-Mcnc: 2.3 g/dL — ABNORMAL HIGH
Total Protein ELP: 8.1 g/dL (ref 6.0–8.5)

## 2024-09-23 ENCOUNTER — Inpatient Hospital Stay: Admitting: Oncology

## 2024-09-23 ENCOUNTER — Encounter: Payer: Self-pay | Admitting: Oncology

## 2024-09-23 VITALS — BP 117/67 | HR 61 | Temp 97.7°F | Resp 17 | Ht 72.0 in | Wt 202.0 lb

## 2024-09-23 DIAGNOSIS — N1832 Chronic kidney disease, stage 3b: Secondary | ICD-10-CM

## 2024-09-23 DIAGNOSIS — C8800 Waldenstrom macroglobulinemia not having achieved remission: Secondary | ICD-10-CM | POA: Diagnosis not present

## 2024-09-23 LAB — IFE+PROTEIN ELECTRO, 24-HR UR
% BETA, Urine: 8.5 %
ALPHA 1 URINE: 1.5 %
Albumin, U: 41.8 %
Alpha 2, Urine: 7.2 %
GAMMA GLOBULIN URINE: 41 %
M-SPIKE %, Urine: 32 % — ABNORMAL HIGH
M-Spike, Mg/24 Hr: 535 mg/(24.h) — ABNORMAL HIGH
Total Protein, Urine-Ur/day: 1671 mg/(24.h) — ABNORMAL HIGH (ref 30–150)
Total Protein, Urine: 77.7 mg/dL
Total Volume: 2150
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
## 2024-09-23 NOTE — Assessment & Plan Note (Addendum)
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  IgM 1954 -->2284--> 2315-->2347-->2760-->2615-->2666 M protein 1.7-->1.9--> 2.2-->2.1-->1.9-->1.9-->2.1-->2.1 Viscosity 2.2-->2.2-->2,2-> 2.3 Stable disease.Patient is asymptomatic.  No signs of hyperviscosity syndrome symptoms  Recommend continue observation.

## 2024-09-23 NOTE — Progress Notes (Signed)
 Patient here for follow-up appointment, expresses no concerns at this time.

## 2024-09-23 NOTE — Assessment & Plan Note (Signed)
 Encourage oral hydration and avoid nephrotoxins.  Cr is getting worse. Refer to nephrology.  If there is concern of light chain nephrology, consider trigger treatment.

## 2025-03-21 ENCOUNTER — Inpatient Hospital Stay

## 2025-04-04 ENCOUNTER — Inpatient Hospital Stay: Admitting: Oncology
